# Patient Record
Sex: Male | Born: 1960 | Race: White | Hispanic: No | Smoking: Current every day smoker
Health system: Southern US, Community
[De-identification: ages and names within clinical notes are randomized; demographics above are authoritative.]

## PROBLEM LIST (undated history)

## (undated) DIAGNOSIS — I639 Cerebral infarction, unspecified: Secondary | ICD-10-CM

## (undated) DIAGNOSIS — M545 Low back pain, unspecified: Secondary | ICD-10-CM

---

## 2021-02-01 DIAGNOSIS — U071 COVID-19: Secondary | ICD-10-CM

## 2021-02-01 HISTORY — DX: COVID-19: U07.1

## 2021-05-23 DIAGNOSIS — M25562 Pain in left knee: Secondary | ICD-10-CM | POA: Diagnosis not present

## 2021-05-23 DIAGNOSIS — M545 Low back pain, unspecified: Secondary | ICD-10-CM | POA: Diagnosis not present

## 2021-05-23 DIAGNOSIS — R29898 Other symptoms and signs involving the musculoskeletal system: Secondary | ICD-10-CM | POA: Diagnosis not present

## 2021-05-23 DIAGNOSIS — M2242 Chondromalacia patellae, left knee: Secondary | ICD-10-CM | POA: Diagnosis not present

## 2021-06-01 DIAGNOSIS — M5126 Other intervertebral disc displacement, lumbar region: Secondary | ICD-10-CM | POA: Diagnosis not present

## 2021-06-01 DIAGNOSIS — M48061 Spinal stenosis, lumbar region without neurogenic claudication: Secondary | ICD-10-CM | POA: Diagnosis not present

## 2021-06-01 DIAGNOSIS — M25552 Pain in left hip: Secondary | ICD-10-CM | POA: Diagnosis not present

## 2021-06-01 DIAGNOSIS — M545 Low back pain, unspecified: Secondary | ICD-10-CM | POA: Diagnosis not present

## 2021-07-06 DIAGNOSIS — M545 Low back pain, unspecified: Secondary | ICD-10-CM | POA: Diagnosis not present

## 2021-09-07 ENCOUNTER — Emergency Department (HOSPITAL_COMMUNITY): Payer: Medicare Other

## 2021-09-07 ENCOUNTER — Encounter (HOSPITAL_COMMUNITY): Payer: Self-pay | Admitting: Critical Care Medicine

## 2021-09-07 ENCOUNTER — Inpatient Hospital Stay (HOSPITAL_COMMUNITY)
Admission: EM | Admit: 2021-09-07 | Discharge: 2021-09-16 | DRG: 917 | Disposition: A | Discharge status: Home Health | Payer: Medicare Other | Attending: Internal Medicine | Admitting: Internal Medicine

## 2021-09-07 DIAGNOSIS — E43 Unspecified severe protein-calorie malnutrition: Secondary | ICD-10-CM | POA: Insufficient documentation

## 2021-09-07 DIAGNOSIS — I959 Hypotension, unspecified: Secondary | ICD-10-CM | POA: Diagnosis not present

## 2021-09-07 DIAGNOSIS — Z20822 Contact with and (suspected) exposure to covid-19: Secondary | ICD-10-CM | POA: Diagnosis not present

## 2021-09-07 DIAGNOSIS — Z8616 Personal history of COVID-19: Secondary | ICD-10-CM

## 2021-09-07 DIAGNOSIS — R000 Tachycardia, unspecified: Secondary | ICD-10-CM | POA: Diagnosis not present

## 2021-09-07 DIAGNOSIS — R0689 Other abnormalities of breathing: Secondary | ICD-10-CM | POA: Diagnosis not present

## 2021-09-07 DIAGNOSIS — R778 Other specified abnormalities of plasma proteins: Secondary | ICD-10-CM | POA: Diagnosis not present

## 2021-09-07 DIAGNOSIS — I5021 Acute systolic (congestive) heart failure: Secondary | ICD-10-CM | POA: Diagnosis not present

## 2021-09-07 DIAGNOSIS — F19129 Other psychoactive substance abuse with intoxication, unspecified: Secondary | ICD-10-CM | POA: Diagnosis present

## 2021-09-07 DIAGNOSIS — F17210 Nicotine dependence, cigarettes, uncomplicated: Secondary | ICD-10-CM | POA: Diagnosis not present

## 2021-09-07 DIAGNOSIS — I2489 Other forms of acute ischemic heart disease: Secondary | ICD-10-CM | POA: Diagnosis present

## 2021-09-07 DIAGNOSIS — T405X1A Poisoning by cocaine, accidental (unintentional), initial encounter: Principal | ICD-10-CM | POA: Diagnosis present

## 2021-09-07 DIAGNOSIS — F79 Unspecified intellectual disabilities: Secondary | ICD-10-CM | POA: Diagnosis present

## 2021-09-07 DIAGNOSIS — N179 Acute kidney failure, unspecified: Secondary | ICD-10-CM | POA: Diagnosis not present

## 2021-09-07 DIAGNOSIS — Z6821 Body mass index (BMI) 21.0-21.9, adult: Secondary | ICD-10-CM

## 2021-09-07 DIAGNOSIS — M545 Low back pain, unspecified: Secondary | ICD-10-CM | POA: Diagnosis present

## 2021-09-07 DIAGNOSIS — E8809 Other disorders of plasma-protein metabolism, not elsewhere classified: Secondary | ICD-10-CM | POA: Diagnosis not present

## 2021-09-07 DIAGNOSIS — I248 Other forms of acute ischemic heart disease: Secondary | ICD-10-CM

## 2021-09-07 DIAGNOSIS — F14929 Cocaine use, unspecified with intoxication, unspecified: Principal | ICD-10-CM

## 2021-09-07 DIAGNOSIS — J439 Emphysema, unspecified: Secondary | ICD-10-CM | POA: Diagnosis not present

## 2021-09-07 DIAGNOSIS — J690 Pneumonitis due to inhalation of food and vomit: Secondary | ICD-10-CM | POA: Diagnosis present

## 2021-09-07 DIAGNOSIS — I4901 Ventricular fibrillation: Secondary | ICD-10-CM | POA: Diagnosis present

## 2021-09-07 DIAGNOSIS — I21A1 Myocardial infarction type 2: Secondary | ICD-10-CM | POA: Diagnosis present

## 2021-09-07 DIAGNOSIS — G929 Unspecified toxic encephalopathy: Secondary | ICD-10-CM

## 2021-09-07 DIAGNOSIS — J9601 Acute respiratory failure with hypoxia: Secondary | ICD-10-CM | POA: Diagnosis present

## 2021-09-07 DIAGNOSIS — I462 Cardiac arrest due to underlying cardiac condition: Secondary | ICD-10-CM | POA: Diagnosis present

## 2021-09-07 DIAGNOSIS — Z9911 Dependence on respirator [ventilator] status: Secondary | ICD-10-CM | POA: Diagnosis not present

## 2021-09-07 DIAGNOSIS — I472 Ventricular tachycardia, unspecified: Secondary | ICD-10-CM | POA: Diagnosis present

## 2021-09-07 DIAGNOSIS — I469 Cardiac arrest, cause unspecified: Secondary | ICD-10-CM | POA: Diagnosis not present

## 2021-09-07 DIAGNOSIS — R0902 Hypoxemia: Secondary | ICD-10-CM | POA: Diagnosis not present

## 2021-09-07 DIAGNOSIS — Z8673 Personal history of transient ischemic attack (TIA), and cerebral infarction without residual deficits: Secondary | ICD-10-CM

## 2021-09-07 DIAGNOSIS — I519 Heart disease, unspecified: Secondary | ICD-10-CM | POA: Diagnosis not present

## 2021-09-07 DIAGNOSIS — F1410 Cocaine abuse, uncomplicated: Secondary | ICD-10-CM | POA: Diagnosis present

## 2021-09-07 DIAGNOSIS — R4020 Unspecified coma: Secondary | ICD-10-CM | POA: Diagnosis not present

## 2021-09-07 DIAGNOSIS — G8929 Other chronic pain: Secondary | ICD-10-CM | POA: Diagnosis present

## 2021-09-07 HISTORY — DX: Cerebral infarction, unspecified: I63.9

## 2021-09-07 HISTORY — DX: Low back pain, unspecified: M54.50

## 2021-09-07 LAB — CBC WITH DIFFERENTIAL/PLATELET
Abs Immature Granulocytes: 0.1 10*3/uL — ABNORMAL HIGH (ref 0.00–0.07)
Basophils Absolute: 0.1 10*3/uL (ref 0.0–0.1)
Basophils Relative: 1 %
Eosinophils Absolute: 0.5 10*3/uL (ref 0.0–0.5)
Eosinophils Relative: 5 %
HCT: 44.5 % (ref 39.0–52.0)
Hemoglobin: 15.1 g/dL (ref 13.0–17.0)
Immature Granulocytes: 1 %
Lymphocytes Relative: 44 %
Lymphs Abs: 5 10*3/uL — ABNORMAL HIGH (ref 0.7–4.0)
MCH: 33.4 pg (ref 26.0–34.0)
MCHC: 33.9 g/dL (ref 30.0–36.0)
MCV: 98.5 fL (ref 80.0–100.0)
Monocytes Absolute: 0.9 10*3/uL (ref 0.1–1.0)
Monocytes Relative: 8 %
Neutro Abs: 4.6 10*3/uL (ref 1.7–7.7)
Neutrophils Relative %: 41 %
Platelets: 368 10*3/uL (ref 150–400)
RBC: 4.52 MIL/uL (ref 4.22–5.81)
RDW: 12.6 % (ref 11.5–15.5)
WBC: 11.2 10*3/uL — ABNORMAL HIGH (ref 4.0–10.5)
nRBC: 0 % (ref 0.0–0.2)

## 2021-09-07 LAB — PROTIME-INR
INR: 1 (ref 0.8–1.2)
Prothrombin Time: 12.9 seconds (ref 11.4–15.2)

## 2021-09-07 MED ORDER — FENTANYL CITRATE PF 50 MCG/ML IJ SOSY
50.0000 ug | PREFILLED_SYRINGE | INTRAMUSCULAR | Status: DC | PRN
  Administered 2021-09-08: 50 ug via INTRAVENOUS
  Filled 2021-09-07 (×2): qty 1

## 2021-09-07 MED ORDER — ROCURONIUM BROMIDE 50 MG/5ML IV SOLN
INTRAVENOUS | Status: DC | PRN
  Administered 2021-09-07: 100 mg via INTRAVENOUS

## 2021-09-07 MED ORDER — PROPOFOL 1000 MG/100ML IV EMUL
INTRAVENOUS | Status: DC | PRN
  Administered 2021-09-07: 10 ug via INTRAVENOUS

## 2021-09-07 MED ORDER — IPRATROPIUM-ALBUTEROL 0.5-2.5 (3) MG/3ML IN SOLN
3.0000 mL | Freq: Four times a day (QID) | RESPIRATORY_TRACT | Status: DC
  Administered 2021-09-08 (×4): 3 mL via RESPIRATORY_TRACT
  Filled 2021-09-07 (×4): qty 3

## 2021-09-07 MED ORDER — DOCUSATE SODIUM 50 MG/5ML PO LIQD
100.0000 mg | Freq: Two times a day (BID) | ORAL | Status: DC
  Administered 2021-09-08: 100 mg
  Filled 2021-09-07 (×4): qty 10

## 2021-09-07 MED ORDER — ONDANSETRON HCL 4 MG/2ML IJ SOLN
4.0000 mg | Freq: Four times a day (QID) | INTRAMUSCULAR | Status: DC | PRN
  Administered 2021-09-08: 4 mg via INTRAVENOUS
  Filled 2021-09-07: qty 2

## 2021-09-07 MED ORDER — AMIODARONE HCL IN DEXTROSE 360-4.14 MG/200ML-% IV SOLN
30.0000 mg/h | INTRAVENOUS | Status: DC
  Administered 2021-09-08 – 2021-09-09 (×2): 30 mg/h via INTRAVENOUS
  Filled 2021-09-07 (×2): qty 200

## 2021-09-07 MED ORDER — FENTANYL CITRATE PF 50 MCG/ML IJ SOSY: 50.0000 ug | PREFILLED_SYRINGE | Freq: Once | INTRAMUSCULAR | Status: DC

## 2021-09-07 MED ORDER — POLYETHYLENE GLYCOL 3350 17 G PO PACK
17.0000 g | PACK | Freq: Every day | ORAL | Status: DC
  Administered 2021-09-08 – 2021-09-16 (×8): 17 g
  Filled 2021-09-07 (×7): qty 1

## 2021-09-07 MED ORDER — AMIODARONE HCL IN DEXTROSE 360-4.14 MG/200ML-% IV SOLN
60.0000 mg/h | INTRAVENOUS | Status: DC
  Administered 2021-09-08 (×2): 60 mg/h via INTRAVENOUS
  Filled 2021-09-07: qty 200
  Filled 2021-09-07: qty 400

## 2021-09-07 MED ORDER — ACETAMINOPHEN 160 MG/5ML PO SOLN: 650.0000 mg | ORAL | Status: DC | PRN

## 2021-09-07 MED ORDER — PROPOFOL 1000 MG/100ML IV EMUL
0.0000 ug/kg/min | INTRAVENOUS | Status: DC
  Administered 2021-09-08: 10 ug/kg/min via INTRAVENOUS
  Filled 2021-09-07: qty 100

## 2021-09-07 MED ORDER — LEVETIRACETAM IN NACL 1000 MG/100ML IV SOLN
1000.0000 mg | Freq: Two times a day (BID) | INTRAVENOUS | Status: DC
  Administered 2021-09-08 – 2021-09-09 (×3): 1000 mg via INTRAVENOUS
  Filled 2021-09-07 (×3): qty 100

## 2021-09-07 MED ORDER — AMIODARONE LOAD VIA INFUSION
150.0000 mg | Freq: Once | INTRAVENOUS | Status: AC
  Administered 2021-09-08: 150 mg via INTRAVENOUS
  Filled 2021-09-07: qty 83.34

## 2021-09-07 MED ORDER — ACETAMINOPHEN 650 MG RE SUPP: 650.0000 mg | RECTAL | Status: DC | PRN

## 2021-09-07 MED ORDER — ETOMIDATE 2 MG/ML IV SOLN
INTRAVENOUS | Status: DC | PRN
  Administered 2021-09-07: 20 mg via INTRAVENOUS

## 2021-09-07 MED ORDER — SODIUM CHLORIDE 0.9 % IV SOLN
INTRAVENOUS | Status: DC | PRN
  Administered 2021-09-07: 1000 mL via INTRAVENOUS

## 2021-09-07 MED ORDER — FENTANYL 2500MCG IN NS 250ML (10MCG/ML) PREMIX INFUSION: 50.0000 ug/h | INTRAVENOUS | Status: DC

## 2021-09-07 MED ORDER — FENTANYL BOLUS VIA INFUSION: 50.0000 ug | INTRAVENOUS | Status: DC | PRN

## 2021-09-07 MED ORDER — DOCUSATE SODIUM 50 MG/5ML PO LIQD: 100.0000 mg | Freq: Two times a day (BID) | ORAL | Status: DC

## 2021-09-07 MED ORDER — HEPARIN SODIUM (PORCINE) 5000 UNIT/ML IJ SOLN: 5000.0000 [IU] | Freq: Three times a day (TID) | INTRAMUSCULAR | Status: DC

## 2021-09-07 MED ORDER — MAGNESIUM SULFATE 50 % IJ SOLN
INTRAMUSCULAR | Status: DC | PRN
  Administered 2021-09-07: 2 g via INTRAVENOUS

## 2021-09-07 MED ORDER — PANTOPRAZOLE 2 MG/ML SUSPENSION
40.0000 mg | Freq: Every day | ORAL | Status: DC
  Administered 2021-09-08: 40 mg
  Filled 2021-09-07: qty 20

## 2021-09-07 MED ORDER — ACETAMINOPHEN 325 MG PO TABS: 650.0000 mg | ORAL_TABLET | ORAL | Status: DC | PRN

## 2021-09-07 MED ORDER — FENTANYL CITRATE PF 50 MCG/ML IJ SOSY
50.0000 ug | PREFILLED_SYRINGE | INTRAMUSCULAR | Status: AC | PRN
  Administered 2021-09-08 (×3): 50 ug via INTRAVENOUS
  Filled 2021-09-07 (×3): qty 1

## 2021-09-07 NOTE — ED Triage Notes (Addendum)
Pt bib ems as post arrest. Pt had witnessed seizure by family and went into cardiac arrest. Per ems, pt was in vtach/vfib. CPR initiated on ems arrival - 6 minutes of cpr, 1 epi and 1 defib with pulses gained. Pt went into vfib en route - 1 defib given. + pulses on arrival. Respirations assisted w/ bvm on arrival. 100mg  lidocaine, 1 epi, and 4 narcan given PTA.

## 2021-09-07 NOTE — ED Notes (Signed)
Patient transported to CT with RN 

## 2021-09-07 NOTE — Progress Notes (Signed)
Pt transported to CT and back to [facility] w/o complications. RT will cont to monitor.

## 2021-09-07 NOTE — ED Provider Notes (Signed)
[facility] EMERGENCY DEPARTMENT Provider Note   CSN: [id number] Arrival date & time: 09/07/21  2244     History  Chief Complaint  Patient presents with   post cpr    Scott Grimes is a 61 y.o. male.  HPI Patient does not have significant known past medical history.  EMS was called to the patient's home for seizure-like activity and unresponsive.  On EMS arrival they report patient had arm flexion but not active seizure.  He was unresponsive.  First rhythm V. tach V-fib patient was defibrillated.  Patient at 6 minutes of CPR.  They had return of circulation.  On route patient had 1 more episode of V-fib with a defibrillation just prior to arrival and milligrams of lidocaine administered and 4 of Narcan given prior to arrival.  Patient was not intubated on arrival and had regular labored respiration.  EMS reports the patient did not follow commands at any point time.  He did not make any vocalizations.  They do indicate he would track with his eyes.    Home Medications Prior to Admission medications   Not on File      Allergies    Patient has no allergy information on record.    Review of Systems   Review of Systems Level 5 caveat cannot obtain review of systems due to condition Physical Exam Updated Vital Signs BP 113/81   Pulse 80   Temp (!) 96.5 F (35.8 C) (Temporal)   Resp 20   Ht 5\' 11"  (1.803 m)   SpO2 100%  Physical Exam Constitutional:      Comments: Patient is getting bag-valve-mask respiratory assist on arrival.  Respirations are labored.  General physical condition appears good.  HENT:     Head: Normocephalic and atraumatic.     Mouth/Throat:     Comments: Edentulous Eyes:     Comments: Pupils are 3 mm and responsive  Neck:     Comments: Jugular veins very distended Cardiovascular:     Comments: Heart is regular.  Distant heart sounds. Pulmonary:     Comments: Coarse breath sounds with bagging. Abdominal:     Comments: Abdomen  soft and nondistended  Musculoskeletal:     Comments: No evident extremity deformities.  Radial pulses are 1+ symmetric.  Lower extremities are good condition without peripheral edema.  Skin:    Comments: Skin is pale.  Cyanotic around the neck.  Neurological:     Comments: Patient is making spontaneous respirations.  He is not making spontaneous movements.  No withdrawal from pain    ED Results / Procedures / Treatments   Labs (all labs ordered are listed, but only abnormal results are displayed) Labs Reviewed  RESP PANEL BY RT-PCR (FLU A&B, COVID) [id number]  CULTURE, BLOOD (ROUTINE X 2)  CULTURE, BLOOD (ROUTINE X 2)  COMPREHENSIVE METABOLIC PANEL  ETHANOL  ACETAMINOPHEN LEVEL  LACTIC ACID, PLASMA  LACTIC ACID, PLASMA  LIPASE, BLOOD  SALICYLATE LEVEL  CBC WITH DIFFERENTIAL/PLATELET  PROTIME-INR  URINALYSIS, ROUTINE W REFLEX MICROSCOPIC  RAPID URINE DRUG SCREEN, HOSP PERFORMED  MAGNESIUM  PHOSPHORUS  TSH  AMMONIA  TROPONIN I (HIGH SENSITIVITY)    EKG EKG Interpretation  Date/Time:  Wednesday September 07 2021 22:48:01 EDT Ventricular Rate:  99 PR Interval:  112 QRS Duration: 118 QT Interval:  345 QTC Calculation: 443 R Axis:   46 Text Interpretation: Sinus rhythm Nonspecific intraventricular conduction delay Low voltage, precordial leads Repol abnrm suggests ischemia, diffuse leads Minimal ST elevation,  inferior leads Baseline wander in lead(s) I V5 V6 agree. no old comparison Confirmed by Charlesetta Shanks [phone]) on 09/07/2021 11:14:14 PM  Radiology No results found.  Procedures Procedure Name: Intubation Date/Time: 09/07/2021 11:14 PM Performed by: Charlesetta Shanks, MD Pre-anesthesia Checklist: Emergency Drugs available, Suction available and Patient being monitored Oxygen Delivery Method: Ambu bag Preoxygenation: Pre-oxygenation with 100% oxygen Induction Type: Rapid sequence Ventilation: Mask ventilation without difficulty Laryngoscope Size: Glidescope and 3 Grade  View: Grade I Tube size: 7.5 mm Number of attempts: 1 Placement Confirmation: ETT inserted through vocal cords under direct vision, Positive ETCO2, CO2 detector and Breath sounds checked- equal and bilateral Secured at: 23 cm Tube secured with: ETT holder Dental Injury: Teeth and Oropharynx as per pre-operative assessment  Difficulty Due To: Difficulty was unanticipated    CRITICAL CARE Performed by: Charlesetta Shanks   Total critical care time: 60 minutes  Critical care time was exclusive of separately billable procedures and treating other patients.  Critical care was necessary to treat or prevent imminent or life-threatening deterioration.  Critical care was time spent personally by me on the following activities: development of treatment plan with patient and/or surrogate as well as nursing, discussions with consultants, evaluation of patient's response to treatment, examination of patient, obtaining history from patient or surrogate, ordering and performing treatments and interventions, ordering and review of laboratory studies, ordering and review of radiographic studies, pulse oximetry and re-evaluation of patient's condition.      Medications Ordered in ED Medications  magnesium sulfate (IV Push/IM) injection (2 g Intravenous Given 09/07/21 2246)  0.9 %  sodium chloride infusion (1,000 mLs Intravenous New Bag/Given 09/07/21 2245)  etomidate (AMIDATE) injection (20 mg Intravenous Given 09/07/21 2252)  rocuronium (ZEMURON) injection (100 mg Intravenous Given 09/07/21 2252)  propofol (DIPRIVAN) 1000 MG/100ML infusion (10 mcg Intravenous New Bag/Given 09/07/21 2300)    ED Course/ Medical Decision Making/ A&P                           Medical Decision Making Amount and/or Complexity of Data Reviewed Labs: ordered. Radiology: ordered.  Risk Prescription drug management. Decision regarding hospitalization.   Patient had arrest at home.  He was found to be in V-fib V. tach.  EMS  got return pulses.  He had 1 additional episode of V-fib prior to arrival.  Patient required 2 episodes of shock and epinephrine.  Patient assessed on arrival and breathing spontaneously with bag valve support.  Patient is obtunded and unresponsive patient is required intubation for airway protection and support.  EMS reported episode of torsade just prior to arrival.  2 g of magnesium ordered by myself to be given IV push.  monitor has narrow complex regular rhythm.  EKG obtained has ischemic ST depression but not STEMI appearance.  Blood pressure has remained stable.  Patient had gotten Narcan prior to arrival with no change.  CBG was in the 200s.  After completing stabilization patient is taken directly to CT scan for concern of potential head injury\intracerebral hemorrhage.  DDx includes ACS\PE\aneurysm or dissection/overdose/\seizure with anoxic brain injury.  Portable chest x-ray reviewed by myself at bedside.  No pneumothorax, mediastinum no significant widening, lungs grossly clear.  Postintubation, patient's vital signs are stable.  He has not required pressors.  Consult: Reviewed with critical care for admission.  Patient has been evaluated in ED and critical care will assume ongoing management and treatment        Final Clinical Impression(s) /  ED Diagnoses Final diagnoses:  Cardiac arrest [facility])    Rx / DC Orders ED Discharge Orders     None         Charlesetta Shanks, MD 09/10/21 [phone]

## 2021-09-07 NOTE — H&P (Signed)
NAME:  Scott Grimes, MRN:  [id number], DOB:  07/05/1960, LOS: 0 ADMISSION DATE:  09/07/2021, CONSULTATION DATE:  09/07/21 REFERRING MD:  Donato Schultz, CHIEF COMPLAINT:  VT arrest   History of Present Illness:  Scott Grimes is a 61 y/o gentleman with a history of LBP who had a witnessed seizure and remained unresponsive. When EMS arrived he was found to have VT then VF. CPR was initiated by EMS when they arrived and he underwent cardioversion with ROSC. He had another episode of VF in transport with cardioversion and ROSC. Lidocaine was administered by EMS as well as narcan. He was intubated in the ED; he was gagging too much to be intubated in the field. Per EMS he was never able to follow commands but was tracking with his eyes.   Per his caregiver, he has been smoking 3-4 ppd recently. In the past he has done drugs, including meth when he had a roommate who was also using meth. Tonight a friend of his who has a history of drugs came over. Later he had a bag of white powder with a straw under the ashtray where he was smoking. Shortly after while he was sitting in his chair, he reported to his caretaker "that didn't taste good; I'm not going to do that again", said he was not feeling well, then fell on the ground not gasping for air. She started CPR and called 911.  Pertinent  Medical History  Chronic back pain Tobacco abuse Drug abuse stroke On disability for intellectual disability  Significant Hospital Events: Including procedures, antibiotic start and stop dates in addition to other pertinent events   6/7 admitted  Interim History / Subjective:    Objective   Blood pressure 130/80, pulse 84, temperature (!) 96.5 F (35.8 C), temperature source Temporal, resp. rate 20, height 5\' 11"  (1.803 m), weight 68 kg, SpO2 100 %.    Vent Mode: PRVC FiO2 (%):  [40 %-100 %] 40 % Set Rate:  [20 bmp] 20 bmp Vt Set:  [600 mL] 600 mL PEEP:  [5 cmH20] 5 cmH20 Plateau Pressure:  [13 cmH20] 13 cmH20    Intake/Output Summary (Last 24 hours) at 09/08/2021 0021 Last data filed at 09/07/2021 2337 Gross per 24 hour  Intake 1000 ml  Output --  Net 1000 ml   Filed Weights   09/07/21 2348  Weight: 68 kg    Examination: General: critically ill appearing man lying in bed in NAD, examined <1 h after rocuronium HENT: [place]/AT, eyes anicteric Lungs: no wheezing, faint rhonchi, obstructed expiratory loops on vent. Not breathing over set rate Cardiovascular: S1S2, RRR Abdomen: soft, NT Extremities: cool feet, no peripheral edema or cyanosis Neuro: under NMB, pupils briskly reactive Derm: warm, dry, no diffuse rashes  EKG: NSR, normal axis, normal intervals. ST depressions laterally.  CXR personally reviewed> ETT ~7cm above carina, no opacities, normal cardiac silhouette  CT head: no acute bleeding, no obvious masses  WBC 11.2 CMP pending Ammonia 24  Resolved Hospital Problem list     Assessment & Plan:  Cardiac arrest- VT, VF- potentially due to acute drug intoxication -amiodarone infusion -post-arrest supportive care-- TTM normothermia, prevent fevers -echo -repeat EKG; initial EKG with ST depressions -monitor electrolytes, replete as needed  Acute respiratory failure with hypoxia, suspect obstructive lung disease  -LTVV -VAP prevention protocol -PAD protocol for sedation -daily SAT & SBT as appropriate -bronchodilators -empiric aspiration pneumonia coverage -covid test pending  Acute encephalopathy, concern for anoxic vs toxic encephalopathy -propofol -supportive  care -UDS pending  Concern for seizure -ok to con't empiric keppra -EEG  Tobacco abuse -will recommend cessation when appropriate  At risk for malnutrition -TF  Caregiver Melissa updated via phone. She provided phone number for his sister, but she did not answer the phone and we were unable to leave a VM.  Best Practice (right click and "Reselect all SmartList Selections" daily)   Diet/type:  tubefeeds DVT prophylaxis: prophylactic heparin  GI prophylaxis: PPI Lines: N/A Foley:  Yes, and it is still needed Code Status:  full code Last date of multidisciplinary goals of care discussion [6/7- with caregiver ]  Labs   CBC: Recent Labs  Lab 09/07/21 2325  WBC 11.2*  NEUTROABS 4.6  HGB 15.1  HCT 44.5  MCV 98.5  PLT 123XX123    Basic Metabolic Panel: No results for input(s): NA, K, CL, CO2, GLUCOSE, BUN, CREATININE, CALCIUM, MG, PHOS in the last 168 hours. GFR: CrCl cannot be calculated (No successful lab value found.). Recent Labs  Lab 09/07/21 2325  WBC 11.2*    Liver Function Tests: No results for input(s): AST, ALT, ALKPHOS, BILITOT, PROT, ALBUMIN in the last 168 hours. No results for input(s): LIPASE, AMYLASE in the last 168 hours. No results for input(s): AMMONIA in the last 168 hours.  ABG No results found for: PHART, PCO2ART, PO2ART, HCO3, TCO2, ACIDBASEDEF, O2SAT   Coagulation Profile: Recent Labs  Lab 09/07/21 2325  INR 1.0    Cardiac Enzymes: No results for input(s): CKTOTAL, CKMB, CKMBINDEX, TROPONINI in the last 168 hours.  HbA1C: No results found for: HGBA1C  CBG: No results for input(s): GLUCAP in the last 168 hours.  Review of Systems:   Unable to be obtained due to intubated/ sedated.  Past Medical History:  He,  has a past medical history of COVID-19 (02/2021), Low back pain, and Stroke ([place]).   Surgical History:  History reviewed. No pertinent surgical history.   Social History:   reports that he has been smoking cigarettes. He does not have any smokeless tobacco history on file.   Family History:  His family history is not on file.   Allergies Not on File   Home Medications  Prior to Admission medications   Not on File     Critical care time: 55 min.     Julian Hy, DO 09/08/21 12:21 AM [facility]

## 2021-09-08 ENCOUNTER — Emergency Department (HOSPITAL_COMMUNITY): Payer: Medicare Other

## 2021-09-08 ENCOUNTER — Encounter (HOSPITAL_COMMUNITY): Payer: Self-pay | Admitting: Critical Care Medicine

## 2021-09-08 ENCOUNTER — Inpatient Hospital Stay (HOSPITAL_COMMUNITY): Payer: Medicare Other

## 2021-09-08 DIAGNOSIS — J439 Emphysema, unspecified: Secondary | ICD-10-CM | POA: Diagnosis present

## 2021-09-08 DIAGNOSIS — G8929 Other chronic pain: Secondary | ICD-10-CM | POA: Diagnosis present

## 2021-09-08 DIAGNOSIS — J9601 Acute respiratory failure with hypoxia: Secondary | ICD-10-CM | POA: Diagnosis present

## 2021-09-08 DIAGNOSIS — I472 Ventricular tachycardia, unspecified: Secondary | ICD-10-CM | POA: Diagnosis present

## 2021-09-08 DIAGNOSIS — I469 Cardiac arrest, cause unspecified: Secondary | ICD-10-CM | POA: Diagnosis present

## 2021-09-08 DIAGNOSIS — I21A1 Myocardial infarction type 2: Secondary | ICD-10-CM | POA: Diagnosis present

## 2021-09-08 DIAGNOSIS — I462 Cardiac arrest due to underlying cardiac condition: Secondary | ICD-10-CM | POA: Diagnosis present

## 2021-09-08 DIAGNOSIS — R778 Other specified abnormalities of plasma proteins: Secondary | ICD-10-CM | POA: Diagnosis not present

## 2021-09-08 DIAGNOSIS — G929 Unspecified toxic encephalopathy: Secondary | ICD-10-CM | POA: Diagnosis present

## 2021-09-08 DIAGNOSIS — F14929 Cocaine use, unspecified with intoxication, unspecified: Secondary | ICD-10-CM | POA: Diagnosis not present

## 2021-09-08 DIAGNOSIS — F19129 Other psychoactive substance abuse with intoxication, unspecified: Secondary | ICD-10-CM | POA: Diagnosis present

## 2021-09-08 DIAGNOSIS — I5021 Acute systolic (congestive) heart failure: Secondary | ICD-10-CM | POA: Diagnosis not present

## 2021-09-08 DIAGNOSIS — N179 Acute kidney failure, unspecified: Secondary | ICD-10-CM | POA: Diagnosis not present

## 2021-09-08 DIAGNOSIS — F1410 Cocaine abuse, uncomplicated: Secondary | ICD-10-CM | POA: Diagnosis present

## 2021-09-08 DIAGNOSIS — I4901 Ventricular fibrillation: Secondary | ICD-10-CM | POA: Diagnosis present

## 2021-09-08 DIAGNOSIS — M545 Low back pain, unspecified: Secondary | ICD-10-CM | POA: Diagnosis present

## 2021-09-08 DIAGNOSIS — Z6821 Body mass index (BMI) 21.0-21.9, adult: Secondary | ICD-10-CM | POA: Diagnosis not present

## 2021-09-08 DIAGNOSIS — Z20822 Contact with and (suspected) exposure to covid-19: Secondary | ICD-10-CM | POA: Diagnosis present

## 2021-09-08 DIAGNOSIS — T405X1A Poisoning by cocaine, accidental (unintentional), initial encounter: Secondary | ICD-10-CM | POA: Diagnosis present

## 2021-09-08 DIAGNOSIS — I519 Heart disease, unspecified: Secondary | ICD-10-CM | POA: Diagnosis not present

## 2021-09-08 DIAGNOSIS — F79 Unspecified intellectual disabilities: Secondary | ICD-10-CM | POA: Diagnosis present

## 2021-09-08 DIAGNOSIS — F17210 Nicotine dependence, cigarettes, uncomplicated: Secondary | ICD-10-CM | POA: Diagnosis present

## 2021-09-08 DIAGNOSIS — J690 Pneumonitis due to inhalation of food and vomit: Secondary | ICD-10-CM | POA: Diagnosis present

## 2021-09-08 DIAGNOSIS — Z8616 Personal history of COVID-19: Secondary | ICD-10-CM | POA: Diagnosis not present

## 2021-09-08 DIAGNOSIS — E8809 Other disorders of plasma-protein metabolism, not elsewhere classified: Secondary | ICD-10-CM | POA: Diagnosis not present

## 2021-09-08 DIAGNOSIS — I959 Hypotension, unspecified: Secondary | ICD-10-CM | POA: Diagnosis not present

## 2021-09-08 DIAGNOSIS — I248 Other forms of acute ischemic heart disease: Secondary | ICD-10-CM | POA: Diagnosis not present

## 2021-09-08 DIAGNOSIS — E43 Unspecified severe protein-calorie malnutrition: Secondary | ICD-10-CM | POA: Diagnosis present

## 2021-09-08 LAB — URINALYSIS, ROUTINE W REFLEX MICROSCOPIC
Bilirubin Urine: NEGATIVE
Glucose, UA: 50 mg/dL — AB
Ketones, ur: NEGATIVE mg/dL
Leukocytes,Ua: NEGATIVE
Nitrite: NEGATIVE
Protein, ur: 100 mg/dL — AB
Specific Gravity, Urine: 1.01 (ref 1.005–1.030)
pH: 5 (ref 5.0–8.0)

## 2021-09-08 LAB — I-STAT ARTERIAL BLOOD GAS, ED
Acid-base deficit: 5 mmol/L — ABNORMAL HIGH (ref 0.0–2.0)
Bicarbonate: 18.1 mmol/L — ABNORMAL LOW (ref 20.0–28.0)
Calcium, Ion: 1.11 mmol/L — ABNORMAL LOW (ref 1.15–1.40)
HCT: 36 % — ABNORMAL LOW (ref 39.0–52.0)
Hemoglobin: 12.2 g/dL — ABNORMAL LOW (ref 13.0–17.0)
O2 Saturation: 100 %
Potassium: 3 mmol/L — ABNORMAL LOW (ref 3.5–5.1)
Sodium: 136 mmol/L (ref 135–145)
TCO2: 19 mmol/L — ABNORMAL LOW (ref 22–32)
pCO2 arterial: 26.1 mmHg — ABNORMAL LOW (ref 32–48)
pH, Arterial: 7.448 (ref 7.35–7.45)
pO2, Arterial: 214 mmHg — ABNORMAL HIGH (ref 83–108)

## 2021-09-08 LAB — GLUCOSE, CAPILLARY
Glucose-Capillary: 97 mg/dL (ref 70–99)
Glucose-Capillary: 84 mg/dL (ref 70–99)
Glucose-Capillary: 106 mg/dL — ABNORMAL HIGH (ref 70–99)
Glucose-Capillary: 94 mg/dL (ref 70–99)
Glucose-Capillary: 106 mg/dL — ABNORMAL HIGH (ref 70–99)
Glucose-Capillary: 94 mg/dL (ref 70–99)

## 2021-09-08 LAB — BASIC METABOLIC PANEL
Anion gap: 11 (ref 5–15)
BUN: 11 mg/dL (ref 6–20)
CO2: 22 mmol/L (ref 22–32)
Calcium: 8.3 mg/dL — ABNORMAL LOW (ref 8.9–10.3)
Chloride: 106 mmol/L (ref 98–111)
Creatinine, Ser: 1.11 mg/dL (ref 0.61–1.24)
GFR, Estimated: >60 mL/min (ref >60)
Glucose, Bld: 108 mg/dL — ABNORMAL HIGH (ref 70–99)
Potassium: 3.8 mmol/L (ref 3.5–5.1)
Sodium: 139 mmol/L (ref 135–145)

## 2021-09-08 LAB — TROPONIN I (HIGH SENSITIVITY)
Troponin I (High Sensitivity): 3375 ng/L (ref <18)
Troponin I (High Sensitivity): 2994 ng/L (ref <18)

## 2021-09-08 LAB — CBC
HCT: 40.8 % (ref 39.0–52.0)
Hemoglobin: 14.2 g/dL (ref 13.0–17.0)
MCH: 32.5 pg (ref 26.0–34.0)
MCHC: 34.8 g/dL (ref 30.0–36.0)
MCV: 93.4 fL (ref 80.0–100.0)
Platelets: 297 10*3/uL (ref 150–400)
RBC: 4.37 MIL/uL (ref 4.22–5.81)
RDW: 12.4 % (ref 11.5–15.5)
WBC: 10.2 10*3/uL (ref 4.0–10.5)
nRBC: 0 % (ref 0.0–0.2)

## 2021-09-08 LAB — ECHOCARDIOGRAM COMPLETE
AR max vel: 3.86 cm2
AV Area VTI: 4.07 cm2
AV Area mean vel: 3.68 cm2
AV Mean grad: 3 mmHg
AV Peak grad: 6.7 mmHg
Ao pk vel: 1.29 m/s
Height: 71 in
S' Lateral: 3.5 cm
Single Plane A4C EF: 31.2 %
Weight: 2592.61 oz

## 2021-09-08 LAB — RAPID URINE DRUG SCREEN, HOSP PERFORMED
Amphetamines: NOT DETECTED
Barbiturates: NOT DETECTED
Benzodiazepines: NOT DETECTED
Cocaine: POSITIVE — AB
Opiates: NOT DETECTED
Tetrahydrocannabinol: NOT DETECTED

## 2021-09-08 LAB — RESP PANEL BY RT-PCR (FLU A&B, COVID) ARPGX2
Influenza A by PCR: NEGATIVE
Influenza B by PCR: NEGATIVE
SARS Coronavirus 2 by RT PCR: NEGATIVE

## 2021-09-08 LAB — COMPREHENSIVE METABOLIC PANEL
ALT: 58 U/L — ABNORMAL HIGH (ref 0–44)
AST: 88 U/L — ABNORMAL HIGH (ref 15–41)
Albumin: 3.2 g/dL — ABNORMAL LOW (ref 3.5–5.0)
Alkaline Phosphatase: 137 U/L — ABNORMAL HIGH (ref 38–126)
Anion gap: 18 — ABNORMAL HIGH (ref 5–15)
BUN: 13 mg/dL (ref 6–20)
CO2: 17 mmol/L — ABNORMAL LOW (ref 22–32)
Calcium: 8.7 mg/dL — ABNORMAL LOW (ref 8.9–10.3)
Chloride: 102 mmol/L (ref 98–111)
Creatinine, Ser: 1.5 mg/dL — ABNORMAL HIGH (ref 0.61–1.24)
GFR, Estimated: 54 mL/min — ABNORMAL LOW (ref >60)
Glucose, Bld: 222 mg/dL — ABNORMAL HIGH (ref 70–99)
Potassium: 3.7 mmol/L (ref 3.5–5.1)
Sodium: 137 mmol/L (ref 135–145)
Total Bilirubin: 0.6 mg/dL (ref 0.3–1.2)
Total Protein: 5.9 g/dL — ABNORMAL LOW (ref 6.5–8.1)

## 2021-09-08 LAB — HEMOGLOBIN A1C
Hgb A1c MFr Bld: 5.3 % (ref 4.8–5.6)
Mean Plasma Glucose: 105.41 mg/dL

## 2021-09-08 LAB — MAGNESIUM
Magnesium: 2.3 mg/dL (ref 1.7–2.4)
Magnesium: 2.6 mg/dL — ABNORMAL HIGH (ref 1.7–2.4)

## 2021-09-08 LAB — CBG MONITORING, ED
Glucose-Capillary: 181 mg/dL — ABNORMAL HIGH (ref 70–99)
Glucose-Capillary: 188 mg/dL — ABNORMAL HIGH (ref 70–99)

## 2021-09-08 LAB — HEPATIC FUNCTION PANEL
ALT: 54 U/L — ABNORMAL HIGH (ref 0–44)
AST: 71 U/L — ABNORMAL HIGH (ref 15–41)
Albumin: 3.2 g/dL — ABNORMAL LOW (ref 3.5–5.0)
Alkaline Phosphatase: 125 U/L (ref 38–126)
Bilirubin, Direct: <0.1 mg/dL (ref 0.0–0.2)
Total Bilirubin: 0.5 mg/dL (ref 0.3–1.2)
Total Protein: 6 g/dL — ABNORMAL LOW (ref 6.5–8.1)

## 2021-09-08 LAB — AMMONIA: Ammonia: 24 umol/L (ref 9–35)

## 2021-09-08 LAB — LACTIC ACID, PLASMA
Lactic Acid, Venous: 3.5 mmol/L (ref 0.5–1.9)
Lactic Acid, Venous: 6 mmol/L (ref 0.5–1.9)

## 2021-09-08 LAB — HEPARIN LEVEL (UNFRACTIONATED)
Heparin Unfractionated: 0.17 IU/mL — ABNORMAL LOW (ref 0.30–0.70)
Heparin Unfractionated: 0.2 IU/mL — ABNORMAL LOW (ref 0.30–0.70)

## 2021-09-08 LAB — ACETAMINOPHEN LEVEL: Acetaminophen (Tylenol), Serum: <10 ug/mL — ABNORMAL LOW (ref 10–30)

## 2021-09-08 LAB — SALICYLATE LEVEL: Salicylate Lvl: <7 mg/dL — ABNORMAL LOW (ref 7.0–30.0)

## 2021-09-08 LAB — TRIGLYCERIDES: Triglycerides: 89 mg/dL (ref <150)

## 2021-09-08 LAB — MRSA NEXT GEN BY PCR, NASAL: MRSA by PCR Next Gen: NOT DETECTED

## 2021-09-08 LAB — PHOSPHORUS
Phosphorus: 4 mg/dL (ref 2.5–4.6)
Phosphorus: 2.6 mg/dL (ref 2.5–4.6)

## 2021-09-08 LAB — LIPASE, BLOOD: Lipase: 35 U/L (ref 11–51)

## 2021-09-08 LAB — ETHANOL: Alcohol, Ethyl (B): <10 mg/dL (ref <10)

## 2021-09-08 LAB — HIV ANTIBODY (ROUTINE TESTING W REFLEX): HIV Screen 4th Generation wRfx: NONREACTIVE

## 2021-09-08 LAB — TSH: TSH: 3.788 u[IU]/mL (ref 0.350–4.500)

## 2021-09-08 MED ORDER — PROSOURCE TF PO LIQD
45.0000 mL | Freq: Two times a day (BID) | ORAL | Status: DC
  Administered 2021-09-08 – 2021-09-10 (×2): 45 mL
  Filled 2021-09-08 (×3): qty 45

## 2021-09-08 MED ORDER — ADULT MULTIVITAMIN W/MINERALS CH
1.0000 | ORAL_TABLET | Freq: Every day | ORAL | Status: DC
  Administered 2021-09-10 – 2021-09-16 (×7): 1 via ORAL
  Filled 2021-09-08 (×7): qty 1

## 2021-09-08 MED ORDER — ASPIRIN 325 MG PO TABS
325.0000 mg | ORAL_TABLET | Freq: Once | ORAL | Status: AC
Start: 2021-09-08 — End: 2021-09-08
  Administered 2021-09-08: 325 mg
  Filled 2021-09-08: qty 1

## 2021-09-08 MED ORDER — ORAL CARE MOUTH RINSE
15.0000 mL | OROMUCOSAL | Status: DC
  Administered 2021-09-08 – 2021-09-09 (×10): 15 mL via OROMUCOSAL

## 2021-09-08 MED ORDER — SODIUM CHLORIDE 0.9 % IV SOLN
250.0000 mL | INTRAVENOUS | Status: DC
  Administered 2021-09-08: 250 mL via INTRAVENOUS

## 2021-09-08 MED ORDER — LORAZEPAM 1 MG PO TABS: 1.0000 mg | ORAL_TABLET | ORAL | Status: AC | PRN

## 2021-09-08 MED ORDER — IOHEXOL 350 MG/ML SOLN
100.0000 mL | Freq: Once | INTRAVENOUS | Status: AC | PRN
Start: 2021-09-08 — End: 2021-09-08
  Administered 2021-09-08: 100 mL via INTRAVENOUS

## 2021-09-08 MED ORDER — LACTATED RINGERS IV BOLUS
2000.0000 mL | Freq: Once | INTRAVENOUS | Status: AC
  Administered 2021-09-08: 2000 mL via INTRAVENOUS

## 2021-09-08 MED ORDER — CHLORHEXIDINE GLUCONATE CLOTH 2 % EX PADS
6.0000 | MEDICATED_PAD | Freq: Every day | CUTANEOUS | Status: DC
  Administered 2021-09-08 – 2021-09-09 (×3): 6 via TOPICAL

## 2021-09-08 MED ORDER — FOLIC ACID 1 MG PO TABS: 1.0000 mg | ORAL_TABLET | Freq: Every day | ORAL | Status: DC

## 2021-09-08 MED ORDER — POTASSIUM CHLORIDE 20 MEQ PO PACK
40.0000 meq | PACK | Freq: Once | ORAL | Status: AC
Start: 2021-09-08 — End: 2021-09-08
  Administered 2021-09-08: 40 meq
  Filled 2021-09-08: qty 2

## 2021-09-08 MED ORDER — AMPICILLIN-SULBACTAM SODIUM 3 (2-1) G IJ SOLR
3.0000 g | Freq: Once | INTRAMUSCULAR | Status: AC
  Administered 2021-09-08: 3 g via INTRAVENOUS
  Filled 2021-09-08 (×2): qty 8

## 2021-09-08 MED ORDER — ASPIRIN 81 MG PO CHEW
81.0000 mg | CHEWABLE_TABLET | Freq: Every day | ORAL | Status: DC
Start: 2021-09-09 — End: 2021-09-09

## 2021-09-08 MED ORDER — NOREPINEPHRINE 4 MG/250ML-% IV SOLN: 0.0000 ug/min | INTRAVENOUS | Status: DC

## 2021-09-08 MED ORDER — LORAZEPAM 2 MG/ML IJ SOLN
1.0000 mg | INTRAMUSCULAR | Status: AC | PRN
  Administered 2021-09-08: 2 mg via INTRAVENOUS
  Filled 2021-09-08: qty 2

## 2021-09-08 MED ORDER — HEPARIN BOLUS VIA INFUSION
2000.0000 [IU] | Freq: Once | INTRAVENOUS | Status: AC
Start: 2021-09-08 — End: 2021-09-08
  Administered 2021-09-08: 2000 [IU] via INTRAVENOUS
  Filled 2021-09-08: qty 2000

## 2021-09-08 MED ORDER — FENTANYL BOLUS VIA INFUSION: 100.0000 ug | INTRAVENOUS | Status: DC | PRN

## 2021-09-08 MED ORDER — ACETAMINOPHEN 650 MG RE SUPP: 650.0000 mg | RECTAL | Status: DC | PRN

## 2021-09-08 MED ORDER — THIAMINE HCL 100 MG PO TABS: 100.0000 mg | ORAL_TABLET | Freq: Every day | ORAL | Status: DC

## 2021-09-08 MED ORDER — ACETAMINOPHEN 325 MG PO TABS
650.0000 mg | ORAL_TABLET | ORAL | Status: DC | PRN
  Administered 2021-09-09 – 2021-09-13 (×3): 650 mg via ORAL
  Filled 2021-09-08 (×4): qty 2

## 2021-09-08 MED ORDER — ACETAMINOPHEN 160 MG/5ML PO SOLN
650.0000 mg | ORAL | Status: DC | PRN
  Administered 2021-09-08: 650 mg
  Filled 2021-09-08: qty 20.3

## 2021-09-08 MED ORDER — VITAL HIGH PROTEIN PO LIQD
1000.0000 mL | ORAL | Status: DC
  Filled 2021-09-08: qty 1000

## 2021-09-08 MED ORDER — LACTATED RINGERS IV BOLUS
1000.0000 mL | Freq: Once | INTRAVENOUS | Status: AC
  Administered 2021-09-08: 1000 mL via INTRAVENOUS

## 2021-09-08 MED ORDER — MAGNESIUM SULFATE 2 GM/50ML IV SOLN
2.0000 g | Freq: Once | INTRAVENOUS | Status: AC
  Administered 2021-09-08: 2 g via INTRAVENOUS
  Filled 2021-09-08: qty 50

## 2021-09-08 MED ORDER — LACTATED RINGERS IV BOLUS: 1000.0000 mL | Freq: Once | INTRAVENOUS | Status: DC

## 2021-09-08 MED ORDER — FENTANYL 2500MCG IN NS 250ML (10MCG/ML) PREMIX INFUSION
0.0000 ug/h | INTRAVENOUS | Status: DC
  Administered 2021-09-08: 50 ug/h via INTRAVENOUS
  Filled 2021-09-08: qty 250

## 2021-09-08 MED ORDER — POTASSIUM CHLORIDE 20 MEQ PO PACK
40.0000 meq | PACK | Freq: Once | ORAL | Status: AC
  Administered 2021-09-08: 40 meq via ORAL
  Filled 2021-09-08: qty 2

## 2021-09-08 MED ORDER — NOREPINEPHRINE 4 MG/250ML-% IV SOLN: 2.0000 ug/min | INTRAVENOUS | Status: DC

## 2021-09-08 MED ORDER — SODIUM CHLORIDE 0.9 % IV SOLN
3.0000 g | Freq: Four times a day (QID) | INTRAVENOUS | Status: AC
  Administered 2021-09-08 – 2021-09-13 (×17): 3 g via INTRAVENOUS
  Filled 2021-09-08 (×21): qty 8

## 2021-09-08 MED ORDER — VITAL 1.5 CAL PO LIQD
1000.0000 mL | ORAL | Status: DC
  Filled 2021-09-08 (×2): qty 1000

## 2021-09-08 MED ORDER — HEPARIN (PORCINE) 25000 UT/250ML-% IV SOLN
1550.0000 [IU]/h | INTRAVENOUS | Status: DC
  Administered 2021-09-08: 800 [IU]/h via INTRAVENOUS
  Administered 2021-09-09: 1200 [IU]/h via INTRAVENOUS
  Filled 2021-09-08 (×4): qty 250

## 2021-09-08 MED ORDER — CHLORHEXIDINE GLUCONATE 0.12% ORAL RINSE (MEDLINE KIT)
15.0000 mL | Freq: Two times a day (BID) | OROMUCOSAL | Status: DC
  Administered 2021-09-08 (×3): 15 mL via OROMUCOSAL

## 2021-09-08 MED ORDER — SODIUM CHLORIDE 0.9 % IV SOLN
1.0000 mg | Freq: Every day | INTRAVENOUS | Status: DC
  Administered 2021-09-08 – 2021-09-12 (×5): 1 mg via INTRAVENOUS
  Filled 2021-09-08 (×7): qty 0.2

## 2021-09-08 MED ORDER — IPRATROPIUM-ALBUTEROL 0.5-2.5 (3) MG/3ML IN SOLN: 3.0000 mL | Freq: Three times a day (TID) | RESPIRATORY_TRACT | Status: DC

## 2021-09-08 MED ORDER — THIAMINE HCL 100 MG/ML IJ SOLN
100.0000 mg | Freq: Every day | INTRAMUSCULAR | Status: DC
  Administered 2021-09-08 – 2021-09-12 (×5): 100 mg via INTRAVENOUS
  Filled 2021-09-08 (×5): qty 2

## 2021-09-08 MED ORDER — HEPARIN BOLUS VIA INFUSION
4000.0000 [IU] | Freq: Once | INTRAVENOUS | Status: AC
  Administered 2021-09-08: 4000 [IU] via INTRAVENOUS
  Filled 2021-09-08: qty 4000

## 2021-09-08 MED ORDER — HEPARIN BOLUS VIA INFUSION
1000.0000 [IU] | Freq: Once | INTRAVENOUS | Status: AC
Start: 2021-09-08 — End: 2021-09-08
  Administered 2021-09-08: 1000 [IU] via INTRAVENOUS
  Filled 2021-09-08: qty 1000

## 2021-09-08 MED ORDER — INSULIN ASPART 100 UNIT/ML IJ SOLN
1.0000 [IU] | INTRAMUSCULAR | Status: DC
  Administered 2021-09-08: 2 [IU] via SUBCUTANEOUS

## 2021-09-08 NOTE — Progress Notes (Signed)
PCCM INTERVAL PROGRESS NOTE   Called by RN for hypotension as well as lactic 3.5 and troponin 3300.   Start heparin infusion per pharmacy until ACS ruled out Repeat EKG  LR bolus for hypotension and will start norepinephrine peripherally if necessary to keep MAP >    Joneen Roach, AGACNP-BC [facility]  See Amion for personal pager PCCM on call pager [phone] until 7pm. Please call Elink 7p-7a. [phone]  09/08/2021 2:48 AM

## 2021-09-08 NOTE — Progress Notes (Signed)
Pt transported from Trauma A to 2H04 w/o complication. RT will cont to monitor as needed.

## 2021-09-08 NOTE — Progress Notes (Signed)
Pharmacy Antibiotic Note  Scott Grimes is a 61 y.o. male admitted on 09/07/2021 s/p cardiac arrest, concern for aspiration pneumonia.  Pharmacy has been consulted for Unasyn dosing.  Plan: Unasyn 3g IV Q6H.  Height: 5\' 11"  (180.3 cm) Weight: 68 kg (150 lb) IBW/kg (Calculated) : 75.3  Temp (24hrs), Avg:96.5 F (35.8 C), Min:96.5 F (35.8 C), Max:96.5 F (35.8 C)  Recent Labs  Lab 09/07/21 2325  WBC 11.2*  CREATININE 1.50*    Estimated Creatinine Clearance: 51.6 mL/min (A) (by C-G formula based on SCr of 1.5 mg/dL (H)).     Thank you for allowing pharmacy to be a part of this patient's care.  11/07/21, PharmD, BCPS  09/08/2021 12:35 AM

## 2021-09-08 NOTE — Progress Notes (Addendum)
[id number] Baas CCM, made aware that pts temp is up to 37.7, BPA popped up alerting that TTM needed to be started up after temp above 37.6. Per Mickel Baas and Dr. Silas Flood, order for TTM to be discontinued and to not start TTM, fever to be controlled with tylenol.   1015: Sister called, this RN unable to give updates at this time, CCM notified and will call sister to update.   1120: Dr. Silas Flood made aware of BP trends, new orders to be placed.

## 2021-09-08 NOTE — ED Notes (Addendum)
NP Hoffman made aware of BP, 2L LR ordered and levophed PRN, verbal order taken due to system downtime.

## 2021-09-08 NOTE — Progress Notes (Signed)
ANTICOAGULATION CONSULT NOTE - Initial Consult  Pharmacy Consult for heparin   Indication: chest pain/ACS  Not on File  Patient Measurements: Height: 5\' 11"  (180.3 cm) Weight: 68 kg (150 lb) IBW/kg (Calculated) : 75.3 Heparin Dosing Weight: 68 Kg  Vital Signs: Temp: 97.4 F (36.3 C) (06/08 0230) Temp Source: Temporal (06/07 2305) BP: 114/86 (06/08 0230) Pulse Rate: 124 (06/08 0230)  Labs: Recent Labs    09/07/21 2325 09/07/21 2350  HGB 15.1 12.2*  HCT 44.5 36.0*  PLT 368  --   LABPROT 12.9  --   INR 1.0  --   CREATININE 1.50*  --   TROPONINIHS 3,375*  --     Estimated Creatinine Clearance: 51.6 mL/min (A) (by C-G formula based on SCr of 1.5 mg/dL (H)).   Medical History: Past Medical History:  Diagnosis Date   COVID-19 02/2021   Low back pain    Stroke [facility])      Assessment: 60yoM  s/p CPR. No past medical history to indicate the need for anticoagulation prior to arrival and no mention of the patient taking anticoagulation at recent a doctors office visit. Initial CBC WNL. Pharmacy consulted to dose heparin while ACS ruled out.   Goal of Therapy:  Heparin level 0.3-0.7 units/ml Monitor platelets by anticoagulation protocol: Yes   Plan:  Give 4000 units bolus x 1 Start heparin infusion at 800 units/hr Check anti-Xa level in 6 hours and daily while on heparin Continue to monitor H&H and platelets  [facility], PharmD Clinical Pharmacist 09/08/2021 3:00 AM Please check AMION for all [facility] Pharmacy numbers

## 2021-09-08 NOTE — ED Notes (Signed)
Pt caregiver requesting updates when available [phone]. Caregiver requesting test and lab results.

## 2021-09-08 NOTE — Progress Notes (Signed)
EEG completed, results pending. 

## 2021-09-08 NOTE — ED Notes (Signed)
NP Hoffman made aware of critical lactic and troponin

## 2021-09-08 NOTE — Progress Notes (Signed)
ANTICOAGULATION CONSULT NOTE   Pharmacy Consult for heparin   Indication: chest pain/ACS  No Known Allergies  Patient Measurements: Height: 5\' 11"  (180.3 cm) Weight: 73.5 kg (162 lb 0.6 oz) IBW/kg (Calculated) : 75.3 Heparin Dosing Weight: 68 Kg  Vital Signs: Temp: 98.2 F (36.8 C) (06/08 1900) Temp Source: Bladder (06/08 1044) BP: 92/72 (06/08 1815) Pulse Rate: 55 (06/08 1948)  Labs: Recent Labs    09/07/21 2325 09/07/21 2350 09/08/21 0258 09/08/21 0558 09/08/21 0929 09/08/21 1904  HGB 15.1 12.2*  --  14.2  --   --   HCT 44.5 36.0*  --  40.8  --   --   PLT 368  --   --  297  --   --   LABPROT 12.9  --   --   --   --   --   INR 1.0  --   --   --   --   --   HEPARINUNFRC  --   --   --   --  0.17* 0.20*  CREATININE 1.50*  --   --  1.11  --   --   TROPONINIHS 3,375*  --  2,994*  --   --   --      Estimated Creatinine Clearance: 75.4 mL/min (by C-G formula based on SCr of 1.11 mg/dL).   Medical History: Past Medical History:  Diagnosis Date   COVID-19 02/2021   Low back pain    Stroke [facility])      Assessment: 60yoM  s/p CPR. No past medical history to indicate the need for anticoagulation prior to arrival and no mention of the patient taking anticoagulation at recent a doctors office visit. Pharmacy consulted to dose heparin as per ACS protocol.  Hgb, Hct, Plt stable Heparin level= 0.2 on 1050 units/hr No s/sx of bleeding  Goal of Therapy:  Heparin level 0.3-0.7 units/ml Monitor platelets by anticoagulation protocol: Yes   Plan:  -Heparin 1000 units IV bolus then increase infusion rate to 1200 units/hr -Heparin level in 6 hours and daily with CBC daily  Luisa Hart, PharmD, BCPS Clinical Pharmacist 09/08/2021 8:04 PM   Please refer to AMION for pharmacy phone number;a

## 2021-09-08 NOTE — Progress Notes (Signed)
ANTICOAGULATION CONSULT NOTE   Pharmacy Consult for heparin   Indication: chest pain/ACS  Not on File  Patient Measurements: Height: 5\' 11"  (180.3 cm) Weight: 73.5 kg (162 lb 0.6 oz) IBW/kg (Calculated) : 75.3 Heparin Dosing Weight: 68 Kg  Vital Signs: Temp: 100 F (37.8 C) (06/08 1044) Temp Source: Bladder (06/08 1044) BP: 103/60 (06/08 0930) Pulse Rate: 50 (06/08 1053)  Labs: Recent Labs    09/07/21 2325 09/07/21 2350 09/08/21 0258 09/08/21 0558 09/08/21 0929  HGB 15.1 12.2*  --  14.2  --   HCT 44.5 36.0*  --  40.8  --   PLT 368  --   --  297  --   LABPROT 12.9  --   --   --   --   INR 1.0  --   --   --   --   HEPARINUNFRC  --   --   --   --  0.17*  CREATININE 1.50*  --   --  1.11  --   TROPONINIHS 3,375*  --  2,994*  --   --      Estimated Creatinine Clearance: 75.4 mL/min (by C-G formula based on SCr of 1.11 mg/dL).   Medical History: Past Medical History:  Diagnosis Date   COVID-19 02/2021   Low back pain    Stroke [facility])      Assessment: 60yoM  s/p CPR. No past medical history to indicate the need for anticoagulation prior to arrival and no mention of the patient taking anticoagulation at recent a doctors office visit. Pharmacy consulted to dose heparin while ACS ruled out (he is noted cocaine positive) -heparin level= 0.17 on 800 units/hr  Goal of Therapy:  Heparin level 0.3-0.7 units/ml Monitor platelets by anticoagulation protocol: Yes   Plan:  -Heparin 2000 units IV bolus then increase to 1050 units/hr -Heparin level in 6 hours and daily wth CBC daily  Hildred Laser, PharmD Clinical Pharmacist **Pharmacist phone directory can now be found on [email or web address] ([email or web address]).  Listed under [place].  e

## 2021-09-08 NOTE — Progress Notes (Addendum)
NAME:  Scott Grimes, MRN:  [id number], DOB:  July 11, 1960, LOS: 0 ADMISSION DATE:  09/07/2021, CONSULTATION DATE:  09/07/21 REFERRING MD:  Dicie Beam, CHIEF COMPLAINT:  VT arrest   History of Present Illness:  Mr. Clementson is a 61 y/o gentleman with a history of LBP who had a witnessed seizure and remained unresponsive. When EMS arrived he was found to have VT then VF. CPR was initiated by EMS when they arrived and he underwent cardioversion with ROSC. He had another episode of VF in transport with cardioversion and ROSC. Lidocaine was administered by EMS as well as narcan. He was intubated in the ED; he was gagging too much to be intubated in the field. Per EMS he was never able to follow commands but was tracking with his eyes.   Per his caregiver, he has been smoking 3-4 ppd recently. In the past he has done drugs, including meth when he had a roommate who was also using meth. Tonight a friend of his who has a history of drugs came over. Later he had a bag of white powder with a straw under the ashtray where he was smoking. Shortly after while he was sitting in his chair, he reported to his caretaker "that didn't taste good; I'm not going to do that again", said he was not feeling well, then fell on the ground not gasping for air. She started CPR and called 911.  Pertinent  Medical History  Chronic back pain Tobacco abuse Drug abuse stroke On disability for intellectual disability  Significant Hospital Events: Including procedures, antibiotic start and stop dates in addition to other pertinent events   6/7 admitted  Interim History / Subjective:  NAEON. No pressors. Intermittently following commands. Passing SBT.  Objective   Blood pressure 101/78, pulse 64, temperature 98.4 F (36.9 C), temperature source Bladder, resp. rate (!) 21, height 5\' 11"  (1.803 m), weight 73.5 kg, SpO2 100 %.    Vent Mode: PSV;CPAP FiO2 (%):  [40 %-100 %] 40 % Set Rate:  [16 bmp-20 bmp] 16 bmp Vt Set:  [600  mL] 600 mL PEEP:  [5 cmH20] 5 cmH20 Pressure Support:  [5 cmH20] 5 cmH20 Plateau Pressure:  [13 cmH20-16 cmH20] 16 cmH20   Intake/Output Summary (Last 24 hours) at 09/08/2021 0834 Last data filed at 09/08/2021 0530 Gross per 24 hour  Intake 1000 ml  Output 350 ml  Net 650 ml    Filed Weights   09/07/21 2348 09/08/21 0500  Weight: 68 kg 73.5 kg    Examination: General: critically ill appearing man lying in bed in NAD HENT: [place]/AT, eyes anicteric Lungs: no wheezing, faint rhonchi, breathing over set rate Cardiovascular: S1S2, RRR Abdomen: soft, NT Extremities: cool feet, no peripheral edema or cyanosis Neuro: awakens to rub, does not follow commands for me Derm: warm, dry, no diffuse rashes  CXR personally reviewed> ETT ~7cm above carina, no opacities, normal cardiac silhouette, hyperinflated  CT head: no acute bleeding, no obvious masses  WBC 11.2>>10.2, nml hgb CMP with mild LFT elevation Ammonia 24  Resolved Hospital Problem list     Assessment & Plan:  Cardiac arrest- VT, VF- likley due to acute drug intoxication -amiodarone infusion -echo -monitor electrolytes, replete as needed  Acute respiratory failure with hypoxia, suspect obstructive lung disease  -LTVV, passing SBT hoping to extubate this AM -VAP prevention protocol -PAD protocol for sedation -daily SAT & SBT as appropriate -bronchodilators -Continue Unasyn for concern for aspiration, thick secretions -covid test negative  Acute encephalopathy,  concern for anoxic vs toxic encephalopathy. Following commands in AM, seems improved. -propofol -supportive care -UDS + for cocaine  Concern for seizure -ok to con't empiric keppra -consider EEG   AKI: Cr improving with fluids. Suspect hypovolemic. --encourage PO intake once extubated  NSTEMI: Troponin peaked ~3400. Related to arrest, cocaine ingestion. --Continue heparin plan 48 hours --Cardiology eval   Elevated LFTs: Suspect related to substance  abuse, NASH? --trend  Tobacco abuse -recommend cessation  At risk for malnutrition -TF -swallow eval once extubated   Best Practice (right click and "Reselect all SmartList Selections" daily)   Diet/type: tubefeeds DVT prophylaxis: prophylactic heparin  GI prophylaxis: PPI Lines: N/A Foley:  Yes, and it is still needed Code Status:  full code Last date of multidisciplinary goals of care discussion [6/8 with Son and Sister]  Labs   CBC: Recent Labs  Lab 09/07/21 2325 09/07/21 2350 09/08/21 0558  WBC 11.2*  --  10.2  NEUTROABS 4.6  --   --   HGB 15.1 12.2* 14.2  HCT 44.5 36.0* 40.8  MCV 98.5  --  93.4  PLT 368  --  297     Basic Metabolic Panel: Recent Labs  Lab 09/07/21 2325 09/07/21 2350 09/08/21 0558  NA 137 136 139  K 3.7 3.0* 3.8  CL 102  --  106  CO2 17*  --  22  GLUCOSE 222*  --  108*  BUN 13  --  11  CREATININE 1.50*  --  1.11  CALCIUM 8.7*  --  8.3*  MG 2.3  --  2.6*  PHOS 4.0  --  2.6   GFR: Estimated Creatinine Clearance: 75.4 mL/min (by C-G formula based on SCr of 1.11 mg/dL). Recent Labs  Lab 09/07/21 2325 09/08/21 0033 09/08/21 0558  WBC 11.2*  --  10.2  LATICACIDVEN 6.0* 3.5*  --      Liver Function Tests: Recent Labs  Lab 09/07/21 2325 09/08/21 0558  AST 88* 71*  ALT 58* 54*  ALKPHOS 137* 125  BILITOT 0.6 0.5  PROT 5.9* 6.0*  ALBUMIN 3.2* 3.2*   Recent Labs  Lab 09/07/21 2325  LIPASE 35   Recent Labs  Lab 09/07/21 2325  AMMONIA 24    ABG    Component Value Date/Time   PHART 7.448 09/07/2021 2350   PCO2ART 26.1 (L) 09/07/2021 2350   PO2ART 214 (H) 09/07/2021 2350   HCO3 18.1 (L) 09/07/2021 2350   TCO2 19 (L) 09/07/2021 2350   ACIDBASEDEF 5.0 (H) 09/07/2021 2350   O2SAT 100 09/07/2021 2350     Coagulation Profile: Recent Labs  Lab 09/07/21 2325  INR 1.0     Cardiac Enzymes: No results for input(s): "CKTOTAL", "CKMB", "CKMBINDEX", "TROPONINI" in the last 168 hours.  HbA1C: Hgb A1c MFr Bld   Date/Time Value Ref Range Status  09/08/2021 05:58 AM 5.3 4.8 - 5.6 % Final    Comment:    (NOTE) Pre diabetes:          5.7%-6.4%  Diabetes:              >6.4%  Glycemic control for   <7.0% adults with diabetes     CBG: Recent Labs  Lab 09/08/21 0032 09/08/21 0242 09/08/21 0543 09/08/21 0739  GLUCAP 188* 181* 97 84    Review of Systems:   Unable to be obtained due to intubated/ sedated.  Past Medical History:  He,  has a past medical history of COVID-19 (02/2021), Low back pain, and Stroke (HCC).  Surgical History:  History reviewed. No pertinent surgical history.   Social History:   reports that he has been smoking cigarettes. He does not have any smokeless tobacco history on file.   Family History:  His family history is not on file.   Allergies Not on File   Home Medications  Prior to Admission medications   Not on File     Critical care time:     CRITICAL CARE Performed by: Karren Burly   Total critical care time: 40 minutes  Critical care time was exclusive of separately billable procedures and treating other patients.  Critical care was necessary to treat or prevent imminent or life-threatening deterioration.  Critical care was time spent personally by me on the following activities: development of treatment plan with patient and/or surrogate as well as nursing, discussions with consultants, evaluation of patient's response to treatment, examination of patient, obtaining history from patient or surrogate, ordering and performing treatments and interventions, ordering and review of laboratory studies, ordering and review of radiographic studies, pulse oximetry and re-evaluation of patient's condition.   Karren Burly, MD  09/08/21 8:34 AM  Pulmonary & Critical Care

## 2021-09-08 NOTE — Progress Notes (Signed)
ETT advanced per MD order. Tube at 23 cm now at 27 per advancement order. RT will cont to monitor.

## 2021-09-08 NOTE — Procedures (Signed)
Patient Name: Scott Grimes  MRN: [id number]  Epilepsy Attending: Charlsie Quest  Referring Physician/Provider: Duayne Cal, NP  Date: 09/08/2021 Duration: 22.18 mins  Patient history: 61 year old male status post cardiac arrest.  EEG to evaluate for seizure.  Level of alertness: Lethargic  AEDs during EEG study: LEV, propofol  Technical aspects: This EEG study was done with scalp electrodes positioned according to the 10-20 International system of electrode placement. Electrical activity was acquired at a sampling rate of 500Hz  and reviewed with a high frequency filter of 70Hz  and a low frequency filter of 1Hz . EEG data were recorded continuously and digitally stored.   Description: EEG showed continuous generalized 5 to 7 Hz theta slowing admixed with intermittent generalized 2-3Hz  delta slowing. Hyperventilation and photic stimulation were not performed.     ABNORMALITY - Continuous slow, generalized  IMPRESSION: This study is suggestive of moderate to severe diffuse encephalopathy, nonspecific etiology. No seizures or epileptiform discharges were seen throughout the recording.  Matylda Fehring 

## 2021-09-08 NOTE — Progress Notes (Signed)
CSW received consult for patient. CSW informed by RN primary contact for patient identified. TOC will continue to follow for any additional needs.

## 2021-09-08 NOTE — Progress Notes (Signed)
Repeat EKG with persistent but improved lateral ST depressions, no TWI. Trop 3K Started on heparin gtt previously-- concern for cocaine induced NSTEMI.  Steffanie Dunn, DO 09/08/21 3:07 AM [place]

## 2021-09-08 NOTE — Progress Notes (Signed)
Initial Nutrition Assessment  DOCUMENTATION CODES:   Severe malnutrition in context of social or environmental circumstances  INTERVENTION:   Initiate tube feeding via OG tube: Vital 1.5 at 20 ml/h, increase by 10 ml every 8 hours to goal rate of 60 ml/h (1440 ml per day). Prosource TF 45 ml BID.  Provides 2240 kcal, 119 gm protein, 1100 ml free water daily.  Monitor magnesium, potassium, and phosphorus BID for at least 3 days, MD to replete as needed, as pt is at risk for refeeding syndrome given severe malnutrition.  NUTRITION DIAGNOSIS:   Severe Malnutrition related to social / environmental circumstances (drug abuse) as evidenced by severe muscle depletion, severe fat depletion.  GOAL:   Patient will meet greater than or equal to 90% of their needs  MONITOR:   Vent status, TF tolerance, Labs  REASON FOR ASSESSMENT:   Ventilator, Consult Enteral/tube feeding initiation and management  ASSESSMENT:   61 yo male admitted S/P witnessed seizure, VT then VF arrest (suspect r/t acute drug intoxication). PMH includes stroke, low back pain, COVID-19, tobacco abuse, drug abuse (meth), intellectual disability.  Received MD Consult for TF initiation and management. OG tube in place with tip in the stomach per x-ray.   Patient is currently intubated on ventilator support MV: 12 L/min Temp (24hrs), Avg:98 F (36.7 C), Min:95.6 F (35.3 C), Max:100 F (37.8 C)  Propofol: 6.1 ml/hr providing 161 kcal from lipid.  Labs reviewed.  UDS + for cocaine.  CBG: 84-106  Medications reviewed and include colace, novolog, miralax, fentanyl, keppra, propofol.  No recent weights available for review.   Patient meets criteria for severe malnutrition, given severe depletion of muscle and subcutaneous fat mass.  NUTRITION - FOCUSED PHYSICAL EXAM:  Flowsheet Row Most Recent Value  Orbital Region Severe depletion  Upper Arm Region Mild depletion  Thoracic and Lumbar Region Moderate  depletion  Buccal Region Severe depletion  Temple Region Severe depletion  Clavicle Bone Region Severe depletion  Clavicle and Acromion Bone Region Mild depletion  Scapular Bone Region Unable to assess  Dorsal Hand Unable to assess  Patellar Region Mild depletion  Anterior Thigh Region Mild depletion  Posterior Calf Region Mild depletion  Edema (RD Assessment) Mild  Hair Reviewed  Eyes Unable to assess  Mouth Unable to assess  Skin Reviewed  Nails Reviewed       Diet Order:   Diet Order     None       EDUCATION NEEDS:   Not appropriate for education at this time  Skin:  Skin Assessment: Reviewed RN Assessment  Last BM:  no BM documented  Height:   Ht Readings from Last 1 Encounters:  09/07/21 5\' 11"  (1.803 m)    Weight:   Wt Readings from Last 1 Encounters:  09/08/21 73.5 kg    Ideal Body Weight:  78.2 kg  BMI:  Body mass index is 22.6 kg/m.  Estimated Nutritional Needs:   Kcal:  2000-2200  Protein:  110-130 gm  Fluid:  2-2.2 L    Lucas Mallow RD, LDN, CNSC Please refer to Amion for contact information.

## 2021-09-08 NOTE — ED Notes (Signed)
Report given to Gardiner Barefoot, RN of 2H4

## 2021-09-08 NOTE — Procedures (Signed)
Extubation Procedure Note  Patient Details:   Name: [name] DOB: 08-May-1960 MRN: [id number]   Airway Documentation:    Vent end date: 09/08/21 Vent end time: 1730   Evaluation  O2 sats: stable throughout Complications: No apparent complications Patient did tolerate procedure well. Bilateral Breath Sounds: Clear, Diminished   Yes, pt could speak.  Pt extubated to 6 l/m [place].  Audrie Lia 09/08/2021, 5:32 PM

## 2021-09-09 ENCOUNTER — Inpatient Hospital Stay (HOSPITAL_COMMUNITY): Payer: Medicare Other

## 2021-09-09 DIAGNOSIS — G929 Unspecified toxic encephalopathy: Secondary | ICD-10-CM

## 2021-09-09 DIAGNOSIS — I519 Heart disease, unspecified: Secondary | ICD-10-CM

## 2021-09-09 DIAGNOSIS — R778 Other specified abnormalities of plasma proteins: Secondary | ICD-10-CM

## 2021-09-09 DIAGNOSIS — E43 Unspecified severe protein-calorie malnutrition: Secondary | ICD-10-CM | POA: Insufficient documentation

## 2021-09-09 DIAGNOSIS — I248 Other forms of acute ischemic heart disease: Secondary | ICD-10-CM | POA: Diagnosis present

## 2021-09-09 DIAGNOSIS — F14929 Cocaine use, unspecified with intoxication, unspecified: Secondary | ICD-10-CM

## 2021-09-09 LAB — BASIC METABOLIC PANEL
Anion gap: 4 — ABNORMAL LOW (ref 5–15)
BUN: 10 mg/dL (ref 6–20)
CO2: 26 mmol/L (ref 22–32)
Calcium: 8.2 mg/dL — ABNORMAL LOW (ref 8.9–10.3)
Chloride: 109 mmol/L (ref 98–111)
Creatinine, Ser: 0.94 mg/dL (ref 0.61–1.24)
GFR, Estimated: >60 mL/min (ref >60)
Glucose, Bld: 94 mg/dL (ref 70–99)
Potassium: 4.7 mmol/L (ref 3.5–5.1)
Sodium: 139 mmol/L (ref 135–145)

## 2021-09-09 LAB — CBC
HCT: 37.9 % — ABNORMAL LOW (ref 39.0–52.0)
Hemoglobin: 12.6 g/dL — ABNORMAL LOW (ref 13.0–17.0)
MCH: 31.8 pg (ref 26.0–34.0)
MCHC: 33.2 g/dL (ref 30.0–36.0)
MCV: 95.7 fL (ref 80.0–100.0)
Platelets: 251 10*3/uL (ref 150–400)
RBC: 3.96 MIL/uL — ABNORMAL LOW (ref 4.22–5.81)
RDW: 12.9 % (ref 11.5–15.5)
WBC: 10.4 10*3/uL (ref 4.0–10.5)
nRBC: 0 % (ref 0.0–0.2)

## 2021-09-09 LAB — HEPARIN LEVEL (UNFRACTIONATED)
Heparin Unfractionated: 0.39 IU/mL (ref 0.30–0.70)
Heparin Unfractionated: 0.32 IU/mL (ref 0.30–0.70)

## 2021-09-09 LAB — PHOSPHORUS: Phosphorus: 2.6 mg/dL (ref 2.5–4.6)

## 2021-09-09 LAB — HEPATIC FUNCTION PANEL
ALT: 40 U/L (ref 0–44)
AST: 44 U/L — ABNORMAL HIGH (ref 15–41)
Albumin: 2.9 g/dL — ABNORMAL LOW (ref 3.5–5.0)
Alkaline Phosphatase: 98 U/L (ref 38–126)
Bilirubin, Direct: <0.1 mg/dL (ref 0.0–0.2)
Total Bilirubin: 0.5 mg/dL (ref 0.3–1.2)
Total Protein: 5.6 g/dL — ABNORMAL LOW (ref 6.5–8.1)

## 2021-09-09 LAB — GLUCOSE, CAPILLARY
Glucose-Capillary: 98 mg/dL (ref 70–99)
Glucose-Capillary: 74 mg/dL (ref 70–99)
Glucose-Capillary: 81 mg/dL (ref 70–99)
Glucose-Capillary: 101 mg/dL — ABNORMAL HIGH (ref 70–99)
Glucose-Capillary: 85 mg/dL (ref 70–99)
Glucose-Capillary: 89 mg/dL (ref 70–99)

## 2021-09-09 LAB — MAGNESIUM: Magnesium: 2.3 mg/dL (ref 1.7–2.4)

## 2021-09-09 MED ORDER — ASPIRIN 81 MG PO CHEW
81.0000 mg | CHEWABLE_TABLET | Freq: Every day | ORAL | Status: DC
  Administered 2021-09-10 – 2021-09-16 (×7): 81 mg via ORAL
  Filled 2021-09-09 (×7): qty 1

## 2021-09-09 MED ORDER — ROSUVASTATIN CALCIUM 20 MG PO TABS
40.0000 mg | ORAL_TABLET | Freq: Every day | ORAL | Status: DC
  Administered 2021-09-09 – 2021-09-16 (×8): 40 mg via ORAL
  Filled 2021-09-09 (×9): qty 2

## 2021-09-09 MED ORDER — IPRATROPIUM-ALBUTEROL 0.5-2.5 (3) MG/3ML IN SOLN: 3.0000 mL | Freq: Four times a day (QID) | RESPIRATORY_TRACT | Status: DC | PRN

## 2021-09-09 MED ORDER — ORAL CARE MOUTH RINSE
15.0000 mL | Freq: Two times a day (BID) | OROMUCOSAL | Status: DC
  Administered 2021-09-09 – 2021-09-16 (×13): 15 mL via OROMUCOSAL

## 2021-09-09 MED ORDER — CHLORHEXIDINE GLUCONATE 0.12 % MT SOLN
15.0000 mL | Freq: Two times a day (BID) | OROMUCOSAL | Status: DC
  Administered 2021-09-09 – 2021-09-16 (×14): 15 mL via OROMUCOSAL
  Filled 2021-09-09 (×12): qty 15

## 2021-09-09 NOTE — Assessment & Plan Note (Signed)
VT cardiac arrest documented.  No recurrence on amiodarone.  Now in sinus bradycardia  EKG shows no clear evidence of ischemia or underlying predisposing disorder.   - Stop amiodarone now - Hold on further intervention.  - Cardiology consultation.

## 2021-09-09 NOTE — Progress Notes (Signed)
ANTICOAGULATION CONSULT NOTE   Pharmacy Consult for heparin   Indication: chest pain/ACS  No Known Allergies  Patient Measurements: Height: 5\' 11"  (180.3 cm) Weight: 73 kg (160 lb 15 oz) IBW/kg (Calculated) : 75.3 Heparin Dosing Weight: 68 Kg  Vital Signs: Temp: 98.8 F (37.1 C) (06/09 0900) BP: 92/65 (06/09 0900) Pulse Rate: 49 (06/09 0900)  Labs: Recent Labs    09/07/21 2325 09/07/21 2350 09/08/21 0258 09/08/21 0558 09/08/21 0929 09/08/21 1904 09/09/21 0141 09/09/21 0735  HGB 15.1 12.2*  --  14.2  --   --  12.6*  --   HCT 44.5 36.0*  --  40.8  --   --  37.9*  --   PLT 368  --   --  297  --   --  251  --   LABPROT 12.9  --   --   --   --   --   --   --   INR 1.0  --   --   --   --   --   --   --   HEPARINUNFRC  --   --   --   --    < > 0.20* 0.39 0.32  CREATININE 1.50*  --   --  1.11  --   --  0.94  --   TROPONINIHS 3,375*  --  2,994*  --   --   --   --   --    < > = values in this interval not displayed.     Estimated Creatinine Clearance: 88.4 mL/min (by C-G formula based on SCr of 0.94 mg/dL).   Medical History: Past Medical History:  Diagnosis Date   COVID-19 02/2021   Low back pain    Stroke [facility])      Assessment: 60yoM  s/p CPR. No past medical history to indicate the need for anticoagulation prior to arrival and no mention of the patient taking anticoagulation at recent a doctors office visit. Pharmacy consulted to dose heparin while ACS ruled out (he is noted cocaine positive) -heparin level= 0.32 on 1200 units/hr  Goal of Therapy:  Heparin level 0.3-0.7 units/ml Monitor platelets by anticoagulation protocol: Yes   Plan:  -Continue heparin 1200 units/hr -Heparin level  daily wth CBC daily  [facility], PharmD Clinical Pharmacist **Pharmacist phone directory can now be found on [email or web address] ([email or web address]).  Listed under [facility].

## 2021-09-09 NOTE — Assessment & Plan Note (Signed)
Moderate, but flat troponin rise with no ischemic changes on ECG.  Denies anginal symptoms.  Echo show RV and LV dysfunction. EF 45%  CTA chest negative for PE  - Cardiac arrest/drug related cardiomyopathy.  - Repeat limited echo prior to proceeding with invasive risk stratification - Start ASA, statin - compliance will be issue.  - Complete 48 h of heparin.

## 2021-09-09 NOTE — TOC Initial Note (Signed)
Transition of Care [facility]) - Initial/Assessment Note    Patient Details  Name: Scott Grimes MRN: [id number] Date of Birth: 1960-06-05  Transition of Care [facility]) CM/SW Contact:    Verdell Carmine, RN Phone Number: 09/09/2021, 2:37 PM  Clinical Narrative:                  Patient admitted to ICU with encephalopathy and cardiac arrest. Patient has history of cocaine use. Was positive for cocaine , EF 40% hypokinesis, Troponin elevated.  Plan: PT evaluation recommends CIR. CIR reviewed and is still too early in hospitalization to be definitive on tolerance, they will follow.  SA resources on patient instructions. TOC will follow for needs, recommendations,and transitions of care   Expected Discharge Plan: [place] Barriers to Discharge: Inadequate or no insurance   Patient Goals and CMS Choice        Expected Discharge Plan and Services Expected Discharge Plan: [place]   Discharge Planning Services: CM Consult   Living arrangements for the past 2 months: Single Family Home                                      Prior Living Arrangements/Services Living arrangements for the past 2 months: Single Family Home   Patient language and need for interpreter reviewed:: Yes        Need for Family Participation in Patient Care: Yes (Comment) Care giver support system in place?: Yes (comment)   Criminal Activity/Legal Involvement Pertinent to Current Situation/Hospitalization: No - Comment as needed  Activities of Daily Living      Permission Sought/Granted                  Emotional Assessment         Alcohol / Substance Use: Illicit Drugs Psych Involvement: No (comment)  Admission diagnosis:  Cardiac arrest [facility]) [I46.9] Patient Active Problem List   Diagnosis Date Noted   Protein-calorie malnutrition, severe 09/09/2021   Demand ischemia ([place]) 09/09/2021   Toxic encephalopathy 09/09/2021   Cocaine use with intoxication with  complication ([place]) [id number]   Cardiac arrest ([place]) 09/08/2021   PCP:  Pcp, No Pharmacy:   [facility] [place] [place] - [address] [place] [address] Phone: [phone] Fax: [phone]     Social Determinants of Health (SDOH) Interventions    Readmission Risk Interventions     No data to display

## 2021-09-09 NOTE — Progress Notes (Signed)
   Inpatient Rehab Admissions Coordinator :  Per therapy recommendations patient was screened for CIR candidacy by Oronde Hallenbeck RN MSN. Patient is not yet at a level to tolerate the intensity required to pursue a CIR admit . Patient may have the potential to progress to become a candidate. The CIR admissions team will follow and monitor for progress and place a Rehab Consult order if felt to be appropriate. Please contact me with any questions.  Katoya Amato RN MSN Admissions Coordinator [phone]  

## 2021-09-09 NOTE — Assessment & Plan Note (Signed)
Urine drug screen positive for cocaine.  Family reports long history of consumption of same and other drugs.   - Cocaine use can explain presentation.  - Long-term treatment of arrhythmia and seizure may not be indicated/effective in the context of drug intoxication

## 2021-09-09 NOTE — Assessment & Plan Note (Signed)
-   Encourage enteral nutrition

## 2021-09-09 NOTE — Consult Note (Addendum)
Cardiology Consultation:   Patient ID: VILLA HOLLRAH MRN: [id number]; DOB: April 17, 1960  Admit date: 09/07/2021 lDate of Consult: 09/09/2021  Primary Care Provider: Merryl Hacker No [facility] Cardiologist: None  [facility] Electrophysiologist:  None    Patient Profile:   Scott Grimes is a 61 y.o. male with a hx of low back pain, tobacco abuse, polysubstance abuse, CVA and disability for intellectual issues who is being seen today for the evaluation of cardiac arrest at the request of Kipp Brood, MD.  History of Present Illness:   Scott Grimes is a 61 year old male with a history of intellectual disability, chronic low back pain, polysubstance abuse with drugs and tobacco.  He was admitted yesterday with a witnessed seizure after which she remained unresponsive.  Apparently the patient had been smoking 3 to 4 packs of cigarettes daily recently and also had been doing drugs including meth and had a roommate who is using that as well.  Apparently the evening prior to admission a friend who was a drug abuser came over with a bag of white powder and a straw and the patient tried it and shortly after while he was sitting in the chair he reported that it did not taste good and then became unwell and collapsed to the ground.  His caregiver started CPR and called 911.  On arrival of EMS he was found to be in ventricular tachycardia which degenerated into ventricular fibrillation.  CPR was initiated by EMS and was shocked with ROSC.  He then had another episode of VT fib in route to the ER with ROSC after cardioversion.  He was started on IV lidocaine and was given Narcan.  Initial labs showed serum creatinine 0.94, sodium 139 potassium 4.7 mildly elevated AST at 44 and troponin elevated at 2994.  Lactate increased at 3.5.  Drug screen was positive for cocaine.  Chest CTA showed no evidence of PE.  There is diffuse peribronchial wall thickening possibly related to infectious or inflammatory process or  edema.  There is also minimal airspace and groundglass opacities in the bilateral lower lobes worrisome for infection.  Patient has underlying severe emphysema.  2D echo done yesterday showed mild LV dysfunction with EF 40 to 45% with anterior septal and inferior septal hypokinesis.  There was also interventricular septal flattening in systole and diastole consistent with RV pressure and volume overload.  He was severe RV hypokinesis with sparing of the apex and the right ventricular function was severely reduced.  There was biatrial enlargement as well.  Patient is now extubated and appears to be back to his baseline with mild mental cognition issues.  Cardiology is now asked to evaluate to rule out cardiac etiology of cardiac arrest although it is presumed that it was drug overdose related He tells me that he has never had any history of chest pain or pressure.  He does have underlying shortness of breath that he relates to his significant tobacco abuse.  He denies any history of palpitations recently although he says when he is done speed in the past he is notices heart rates.  Past Medical History:  Diagnosis Date   COVID-19 02/2021   Low back pain    Stroke [facility])     History reviewed. No pertinent surgical history.   Home Medications:  Prior to Admission medications   Medication Sig Start Date End Date Taking? Authorizing Provider  albuterol (VENTOLIN HFA) 108 (90 Base) MCG/ACT inhaler Inhale 4 puffs into the lungs every 4 (four) hours  as needed for shortness of breath. 02/01/21 02/01/22 Yes [provider]    Inpatient Medications: Scheduled Meds:  [START ON 09/10/2021] aspirin  81 mg Oral Daily   chlorhexidine  15 mL Mouth Rinse BID   Chlorhexidine Gluconate Cloth  6 each Topical Daily   docusate  100 mg Per Tube BID   feeding supplement (PROSource TF)  45 mL Per Tube BID   insulin aspart  1-3 Units Subcutaneous Q4H   mouth rinse  15 mL Mouth Rinse q12n4p   multivitamin  with minerals  1 tablet Oral Daily   polyethylene glycol  17 g Per Tube Daily   rosuvastatin  40 mg Oral Daily   thiamine injection  100 mg Intravenous Daily   Continuous Infusions:  sodium chloride Stopped (09/07/21 2337)   sodium chloride Stopped (09/08/21 1130)   ampicillin-sulbactam (UNASYN) IV Stopped (09/09/21 0544)   feeding supplement (VITAL 1.5 CAL)     folic acid 1 mg in sodium chloride 0.9 % 50 mL IVPB 1 mg (09/09/21 1049)   heparin 1,200 Units/hr (09/09/21 0900)   PRN Meds: sodium chloride, acetaminophen **OR** acetaminophen (TYLENOL) oral liquid 160 mg/5 mL **OR** acetaminophen, ipratropium-albuterol, LORazepam **OR** LORazepam, magnesium sulfate, ondansetron (ZOFRAN) IV  Allergies:   No Known Allergies  Social History:   Social History   Socioeconomic History   Marital status: Divorced    Spouse name: Not on file   Number of children: Not on file   Years of education: Not on file   Highest education level: Not on file  Occupational History   Not on file  Tobacco Use   Smoking status: Every Day    Types: Cigarettes   Smokeless tobacco: Not on file  Substance and Sexual Activity   Alcohol use: Not on file   Drug use: Not on file   Sexual activity: Not on file  Other Topics Concern   Not on file  Social History Narrative   Not on file   Social Determinants of Health   Financial Resource Strain: Not on file  Food Insecurity: Not on file  Transportation Needs: Not on file  Physical Activity: Not on file  Stress: Not on file  Social Connections: Not on file  Intimate Partner Violence: Not on file    Family History:   History reviewed. No pertinent family history.   ROS:  Please see the history of present illness.   All other ROS reviewed and negative.     Physical Exam/Data:   Vitals:   09/09/21 1000 09/09/21 1100 09/09/21 1143 09/09/21 1200  BP: 99/65     Pulse: (!) 49 61 (!) 58 (!) 58  Resp: 18 16 16 16   Temp: 99 F (37.2 C) 99.1 F (37.3  C) 98.1 F (36.7 C)   TempSrc:   Oral   SpO2: 99% 92% (!) 89% 95%  Weight:      Height:        Intake/Output Summary (Last 24 hours) at 09/09/2021 1251 Last data filed at 09/09/2021 1100 Gross per 24 hour  Intake 1876.26 ml  Output 995 ml  Net 881.26 ml      09/09/2021    5:00 AM 09/08/2021    5:00 AM 09/07/2021   11:48 PM  Last 3 Weights  Weight (lbs) 160 lb 15 oz 162 lb 0.6 oz 150 lb  Weight (kg) 73 kg 73.5 kg 68.04 kg     Body mass index is 22.45 kg/m.  General:  Well nourished, well developed,  in no acute distress HEENT: normal Lymph: no adenopathy Neck: no JVD Endocrine:  No thryomegaly Vascular: No carotid bruits; FA pulses 2+ bilaterally without bruits  Cardiac:  normal S1, S2; RRR; no murmur  Lungs:  clear to auscultation bilaterally, no wheezing, rhonchi or rales  Abd: soft, nontender, no hepatomegaly  Ext: no edema Musculoskeletal:  No deformities, BUE and BLE strength normal and equal Skin: warm and dry  Neuro:  CNs 2-12 intact, no focal abnormalities noted Psych:  Normal affect   EKG:  The initial EKG was personally reviewed and demonstrates: Sinus rhythm with incomplete right bundle branch block and millimeters ST segment depression in the anterolateral precordial leads.  Pete EKG showed normal sinus rhythm with incomplete right bundle branch block and nonspecific ST abnormality in the anterolateral precordial leads Telemetry:  Telemetry was personally reviewed and demonstrates: Sinus rhythm  Relevant CV Studies: 2D echo 09/08/2021 IMPRESSIONS    1. Anteroseptal and inferoseptal hypokinesis. Left ventricular ejection  fraction, by estimation, is 40 to 45%. The left ventricle has mildly  decreased function. The left ventricle demonstrates regional wall motion  abnormalities (see scoring  diagram/findings for description). Left ventricular diastolic parameters  are indeterminate. There is the interventricular septum is flattened in  systole and diastole,  consistent with right ventricular pressure and  volume overload.   2. McConnell's sign. Severe RV hypokinesis with sparing of the apex. .  Right ventricular systolic function is severely reduced. The right  ventricular size is mildly enlarged. There is normal pulmonary artery  systolic pressure.   3. Left atrial size was moderately dilated.   4. Right atrial size was moderately dilated.   5. The mitral valve is normal in structure. No evidence of mitral valve  regurgitation. No evidence of mitral stenosis.   6. The aortic valve is tricuspid. Aortic valve regurgitation is not  visualized. No aortic stenosis is present.   7. The inferior vena cava is dilated in size with <50% respiratory  variability, suggesting right atrial pressure of 15 mmHg.   Laboratory Data:  High Sensitivity Troponin:   Recent Labs  Lab 09/07/21 2325 09/08/21 0258  TROPONINIHS 3,375* 2,994*     Chemistry Recent Labs  Lab 09/07/21 2325 09/07/21 2350 09/08/21 0558 09/09/21 0141  NA 137 136 139 139  K 3.7 3.0* 3.8 4.7  CL 102  --  106 109  CO2 17*  --  22 26  GLUCOSE 222*  --  108* 94  BUN 13  --  11 10  CREATININE 1.50*  --  1.11 0.94  CALCIUM 8.7*  --  8.3* 8.2*  GFRNONAA 54*  --  >60 >60  ANIONGAP 18*  --  11 4*    Recent Labs  Lab 09/07/21 2325 09/08/21 0558 09/09/21 0141  PROT 5.9* 6.0* 5.6*  ALBUMIN 3.2* 3.2* 2.9*  AST 88* 71* 44*  ALT 58* 54* 40  ALKPHOS 137* 125 98  BILITOT 0.6 0.5 0.5   Hematology Recent Labs  Lab 09/07/21 2325 09/07/21 2350 09/08/21 0558 09/09/21 0141  WBC 11.2*  --  10.2 10.4  RBC 4.52  --  4.37 3.96*  HGB 15.1 12.2* 14.2 12.6*  HCT 44.5 36.0* 40.8 37.9*  MCV 98.5  --  93.4 95.7  MCH 33.4  --  32.5 31.8  MCHC 33.9  --  34.8 33.2  RDW 12.6  --  12.4 12.9  PLT 368  --  297 251   BNPNo results for input(s): "BNP", "PROBNP" in the last  168 hours.  DDimer No results for input(s): "DDIMER" in the last 168 hours.   Radiology/Studies:  CT Angio Chest  Pulmonary Embolism (PE) W or WO Contrast  Result Date: 09/08/2021 CLINICAL DATA:  Concern for PE on echo. EXAM: CT ANGIOGRAPHY CHEST WITH CONTRAST TECHNIQUE: Multidetector CT imaging of the chest was performed using the standard protocol during bolus administration of intravenous contrast. Multiplanar CT image reconstructions and MIPs were obtained to evaluate the vascular anatomy. RADIATION DOSE REDUCTION: This exam was performed according to the departmental dose-optimization program which includes automated exposure control, adjustment of the mA and/or kV according to patient size and/or use of iterative reconstruction technique. CONTRAST:  15mL OMNIPAQUE IOHEXOL 350 MG/ML SOLN COMPARISON:  None Available. FINDINGS: Cardiovascular: Satisfactory opacification of the pulmonary arteries to the segmental level. No evidence of pulmonary embolism. Normal heart size. No pericardial effusion. There are atherosclerotic calcifications of the coronary arteries. Mediastinum/Nodes: There is some mildly hyperdense fluid and stranding in the anterior mediastinum at the level of the heart. There are no enlarged mediastinal or hilar lymph nodes. The visualized thyroid gland is within normal limits. Small hiatal hernia is present. Lungs/Pleura: Severe emphysematous changes are present. There is diffuse peribronchial wall thickening, most significant in the right lower lobe. Trachea and central airways are patent. There is some patchy airspace disease and ground-glass opacity in the bilateral lung bases/lower lobes. There are trace bilateral pleural effusions. Upper Abdomen: No acute abnormality. Musculoskeletal: No chest wall abnormality. No acute or significant osseous findings. Review of the MIP images confirms the above findings. IMPRESSION: 1. No evidence for pulmonary embolism. 2. Small amount of fluid and stranding in the anterior mediastinum of uncertain etiology. Please correlate clinically for infection or resolving  anterior mediastinal hematoma. 3. Diffuse peribronchial wall thickening may be infectious/inflammatory or related to edema. 4. Minimal airspace and ground-glass opacities in the bilateral lower lobes worrisome for infection. 5. Trace bilateral pleural effusions. 6. Severe emphysema. Emphysema (ICD10-J43.9). Electronically Signed   By: Ronney Asters M.D.   On: 09/08/2021 21:34   ECHOCARDIOGRAM COMPLETE  Result Date: 09/08/2021    ECHOCARDIOGRAM REPORT   Patient Name:   KHIZER TOEWS Date of Exam: 09/08/2021 Medical Rec #:  [id number]        Height:       71.0 in Accession #:    [id number]       Weight:       162.0 lb Date of Birth:  04-Apr-1960        BSA:          1.928 m Patient Age:    44 years         BP:           101/78 mmHg Patient Gender: M                HR:           74 bpm. Exam Location:  Inpatient Procedure: 2D Echo, Cardiac Doppler and Color Doppler Indications:   Cardiac arrest  History:       Patient has no prior history of Echocardiogram examinations.                Stroke.  Sonographer:   [place] Referring      [phone] Julian Hy Phys:  Sonographer Comments: Technically challenging study due to limited acoustic windows and echo performed with patient supine and on artificial respirator. IMPRESSIONS  1. Anteroseptal and inferoseptal hypokinesis. Left ventricular ejection fraction,  by estimation, is 40 to 45%. The left ventricle has mildly decreased function. The left ventricle demonstrates regional wall motion abnormalities (see scoring diagram/findings for description). Left ventricular diastolic parameters are indeterminate. There is the interventricular septum is flattened in systole and diastole, consistent with right ventricular pressure and volume overload.  2. McConnell's sign. Severe RV hypokinesis with sparing of the apex. . Right ventricular systolic function is severely reduced. The right ventricular size is mildly enlarged. There is normal pulmonary artery systolic  pressure.  3. Left atrial size was moderately dilated.  4. Right atrial size was moderately dilated.  5. The mitral valve is normal in structure. No evidence of mitral valve regurgitation. No evidence of mitral stenosis.  6. The aortic valve is tricuspid. Aortic valve regurgitation is not visualized. No aortic stenosis is present.  7. The inferior vena cava is dilated in size with <50% respiratory variability, suggesting right atrial pressure of 15 mmHg. FINDINGS  Left Ventricle: Anteroseptal and inferoseptal hypokinesis. Left ventricular ejection fraction, by estimation, is 40 to 45%. The left ventricle has mildly decreased function. The left ventricle demonstrates regional wall motion abnormalities. The left ventricular internal cavity size was normal in size. There is no left ventricular hypertrophy. The interventricular septum is flattened in systole and diastole, consistent with right ventricular pressure and volume overload. Left ventricular diastolic parameters are indeterminate. Right Ventricle: McConnell's sign. Severe RV hypokinesis with sparing of the apex. The right ventricular size is mildly enlarged. No increase in right ventricular wall thickness. Right ventricular systolic function is severely reduced. There is normal pulmonary artery systolic pressure. The tricuspid regurgitant velocity is 2.13 m/s, and with an assumed right atrial pressure of 15 mmHg, the estimated right ventricular systolic pressure is [id number] mmHg. Left Atrium: Left atrial size was moderately dilated. Right Atrium: Right atrial size was moderately dilated. Prominent Eustachian valve. Pericardium: Trivial pericardial effusion is present. Mitral Valve: The mitral valve is normal in structure. No evidence of mitral valve regurgitation. No evidence of mitral valve stenosis. Tricuspid Valve: The tricuspid valve is normal in structure. Tricuspid valve regurgitation is mild . No evidence of tricuspid stenosis. Aortic Valve: The aortic  valve is tricuspid. Aortic valve regurgitation is not visualized. No aortic stenosis is present. Aortic valve mean gradient measures 3.0 mmHg. Aortic valve peak gradient measures 6.7 mmHg. Aortic valve area, by VTI measures 4.07 cm. Pulmonic Valve: The pulmonic valve was normal in structure. Pulmonic valve regurgitation is not visualized. No evidence of pulmonic stenosis. Aorta: The aortic root is normal in size and structure. Venous: The inferior vena cava is dilated in size with less than 50% respiratory variability, suggesting right atrial pressure of 15 mmHg. IAS/Shunts: No atrial level shunt detected by color flow Doppler. Additional Comments: McConnell's sign. Severe RV hypokinesis with sparing of the apex. Consider pulmonary embolism.  LEFT VENTRICLE PLAX 2D LVIDd:         4.30 cm     Diastology LVIDs:         3.50 cm     LV e' medial:  9.48 cm/s LV PW:         1.00 cm     LV e' lateral: 17.30 cm/s LV IVS:        0.90 cm LVOT diam:     2.40 cm LV SV:         93 LV SV Index:   48 LVOT Area:     4.52 cm  LV Volumes (MOD) LV vol d, MOD A4C: 61.2  ml LV vol s, MOD A4C: 42.1 ml LV SV MOD A4C:     61.2 ml RIGHT VENTRICLE RV Basal diam:  4.10 cm RV Mid diam:    4.20 cm RV S prime:     13.90 cm/s TAPSE (M-mode): 1.9 cm LEFT ATRIUM             Index        RIGHT ATRIUM           Index LA Vol (A2C):   69.5 ml 36.05 ml/m  RA Area:     17.50 cm LA Vol (A4C):   71.5 ml 37.08 ml/m  RA Volume:   47.30 ml  24.53 ml/m LA Biplane Vol: 72.8 ml 37.76 ml/m  AORTIC VALVE                    PULMONIC VALVE AV Area (Vmax):    3.86 cm     PV Vmax:       0.88 m/s AV Area (Vmean):   3.68 cm     PV Vmean:      60.750 cm/s AV Area (VTI):     4.07 cm     PV VTI:        0.194 m AV Vmax:           129.00 cm/s  PV Peak grad:  3.1 mmHg AV Vmean:          83.400 cm/s  PV Mean grad:  1.5 mmHg AV VTI:            0.228 m AV Peak Grad:      6.7 mmHg AV Mean Grad:      3.0 mmHg LVOT Vmax:         110.00 cm/s LVOT Vmean:        67.900 cm/s  LVOT VTI:          0.205 m LVOT/AV VTI ratio: 0.90  AORTA Ao Root diam: 3.40 cm TRICUSPID VALVE TR Peak grad:   18.1 mmHg TR Vmax:        213.00 cm/s  SHUNTS Systemic VTI:  0.20 m Systemic Diam: 2.40 cm Skeet Latch MD Electronically signed by Skeet Latch MD Signature Date/Time: 09/08/2021/3:40:58 PM    Final    EEG adult  Result Date: 09/08/2021 Lora Havens, MD     09/08/2021  9:20 AM Patient Name: KAELON FISHLER MRN: [id number] Epilepsy Attending: Lora Havens Referring Physician/Provider: Corey Harold, NP Date: 09/08/2021 Duration: 22.18 mins Patient history: 61 year old male status post cardiac arrest.  EEG to evaluate for seizure. Level of alertness: Lethargic AEDs during EEG study: LEV, propofol Technical aspects: This EEG study was done with scalp electrodes positioned according to the 10-20 International system of electrode placement. Electrical activity was acquired at a sampling rate of 500Hz  and reviewed with a high frequency filter of 70Hz  and a low frequency filter of 1Hz . EEG data were recorded continuously and digitally stored. Description: EEG showed continuous generalized 5 to 7 Hz theta slowing admixed with intermittent generalized 2-3Hz  delta slowing. Hyperventilation and photic stimulation were not performed.   ABNORMALITY - Continuous slow, generalized IMPRESSION: This study is suggestive of moderate to severe diffuse encephalopathy, nonspecific etiology. No seizures or epileptiform discharges were seen throughout the recording. Lora Havens   DG Abd Portable 1 View  Result Date: 09/08/2021 CLINICAL DATA:  OG tube placement. EXAM: PORTABLE ABDOMEN - 1 VIEW COMPARISON:  None Available. FINDINGS: Paucity of gas in  the small and large bowel. Small amount of air is noted in the rectum. An enteric tube terminates in the stomach. No radio-opaque calculi or other significant radiographic abnormality are seen. IMPRESSION: 1. Paucity of bowel gas in the small and large bowel  and small amount of gas in the rectum. Clinical correlation is recommended. 2. The enteric tube terminates in the stomach. Electronically Signed   By: Brett Fairy M.D.   On: 09/08/2021 00:33   DG Chest Portable 1 View  Result Date: 09/08/2021 CLINICAL DATA:  Endotracheal tube placement. EXAM: PORTABLE CHEST 1 VIEW COMPARISON:  09/07/2021. FINDINGS: The heart size and mediastinal contours are within normal limits. The visualized lungs are clear. In the right lateral lung is excluded from the field of view. No definite effusion or pneumothorax. No acute osseous abnormality. The endotracheal tube terminates 5.0 cm above the carina. An enteric tube terminates in the stomach. IMPRESSION: 1. No acute abnormality. 2. Medical devices as described above. Electronically Signed   By: Brett Fairy M.D.   On: 09/08/2021 00:29   CT Head Wo Contrast  Result Date: 09/07/2021 CLINICAL DATA:  Seizure disorder, clinical change.  Post CPR EXAM: CT HEAD WITHOUT CONTRAST TECHNIQUE: Contiguous axial images were obtained from the base of the skull through the vertex without intravenous contrast. RADIATION DOSE REDUCTION: This exam was performed according to the departmental dose-optimization program which includes automated exposure control, adjustment of the mA and/or kV according to patient size and/or use of iterative reconstruction technique. COMPARISON:  CT head 01/30/2021 BRAIN: BRAIN Brain: Patchy and confluent areas of decreased attenuation are noted throughout the deep and periventricular white matter of the cerebral hemispheres bilaterally, compatible with chronic microvascular ischemic disease. No evidence of large-territorial acute infarction. No parenchymal hemorrhage. No mass lesion. No extra-axial collection. No mass effect or midline shift. No hydrocephalus. Basilar cisterns are patent. Vascular: No hyperdense vessel. Skull: No acute fracture or focal lesion. Sinuses/Orbits: Mucosal thickening of the bilateral  maxillary, sphenoid, ethmoid sinuses. Paranasal sinuses and mastoid air cells are clear. The orbits are unremarkable. Other: Frothy secretion within the nasal cavities and nasopharynx. IMPRESSION: 1. No acute intracranial abnormality. 2. Frothy secretion within the nasal cavities and nasopharynx. Electronically Signed   By: Iven Finn M.D.   On: 09/07/2021 23:38   DG Chest Portable 1 View  Result Date: 09/07/2021 CLINICAL DATA:  Post CPR. EXAM: PORTABLE CHEST 1 VIEW COMPARISON:  Chest x-ray 01/30/2021 FINDINGS: Endotracheal tube tip is 6 cm above the carina. Enteric tube extends below the diaphragm. Lungs are clear. There is no pleural effusion or pneumothorax. No acute fractures are seen. Cardiomediastinal silhouette is within normal limits. IMPRESSION: 1. Endotracheal tube tip 6 cm above carina. 2. Enteric tube extends below the diaphragm. 3. The lungs are clear. Electronically Signed   By: Ronney Asters M.D.   On: 09/07/2021 23:13     Assessment and Plan:   Cardiac Arrest -presumed to be related to drug overdoes with cocaine and questionably got some tainted cocaine as he says it did not taste or feel right and then suddenly collapsed with seizure activity and remained unresponsive - immediate CPR started by his caregiver and shocked by EMS for ventricular tachycardia that degraded to V-fib with recurrent V-fib in route to the hospital again with 1 shock with ROSC. -Had any further arrhythmias on telemetry -He does have a history of tobacco abuse and given his age he is at risk of CAD. -At bedtime troponin elevated at 2994 but he  was defibrillated 2 times>>likely demand ischemia -Echo showed mild LV dysfunction with focal wall motion abnormalities and EF 45 to 50% so he does need some type of ischemic work-up even though it was presumed this was a cardiac arrest related to drug overdose. -IV amiodarone has been stopped -Chest CT negative for PE -Neurologically appears back to his baseline  with mild cognitive limitations -He needs further ischemic work-up given recent VT/VF cardiac arrest.  Even though this was done in the setting of cocaine use he has a significant history of severe tobacco abuse and likely has underlying CAD. -Continue aspirin, IV heparin drip and statin -No beta-blocker due to soft blood pressure, bradycardia and cocaine use  2.  LV dysfunction -LVF is mildly reduced with focal regional wall motion abnormalities in the anteroseptal and inferoseptal walls>>no prior echo to review -Cannot start beta-blocker due to hypotension, bradycardia and recent cocaine use and cannot use ACE/ARB/ARN I/MRA due to hypotension -Continue ASA 81 mg daily and statin therapy with Crestor 40 mg daily -FLP and ALT in a.m.     The patient is critically ill with multiple organ systems failure and requires high complexity decision making for assessment and support, frequent evaluation and titration of therapies, application of advanced monitoring technologies and extensive interpretation of multiple databases. Critical Care Time devoted to patient care services described in this note independent of APP time is 60 minutes with >50% of time spent in direct patient care.    For questions or updates, please contact [place] Please consult [email or web address] for contact info under    Signed, Fransico Him, MD  09/09/2021 12:51 PM

## 2021-09-09 NOTE — Progress Notes (Signed)
NAME:  Scott Grimes, MRN:  [id number], DOB:  1961/02/06, LOS: 1 ADMISSION DATE:  09/07/2021, CONSULTATION DATE:  09/07/21 REFERRING MD:  Dicie Beam, CHIEF COMPLAINT:  VT arrest   History of Present Illness:  Mr. Scott Grimes is a 61 y/o gentleman with a history of LBP who had a witnessed seizure and remained unresponsive. When EMS arrived he was found to have VT then VF. CPR was initiated by EMS when they arrived and he underwent cardioversion with ROSC. He had another episode of VF in transport with cardioversion and ROSC. Lidocaine was administered by EMS as well as narcan. He was intubated in the ED; he was gagging too much to be intubated in the field. Per EMS he was never able to follow commands but was tracking with his eyes.   Per his caregiver, he has been smoking 3-4 ppd recently. In the past he has done drugs, including meth when he had a roommate who was also using meth. Tonight a friend of his who has a history of drugs came over. Later he had a bag of white powder with a straw under the ashtray where he was smoking. Shortly after while he was sitting in his chair, he reported to his caretaker "that didn't taste good; I'm not going to do that again", said he was not feeling well, then fell on the ground not gasping for air. She started CPR and called 911.  Pertinent  Medical History  Chronic back pain Tobacco abuse Drug abuse stroke On disability for intellectual disability  Significant Hospital Events: Including procedures, antibiotic start and stop dates in addition to other pertinent events   6/7 admitted 6/8 extubated yesterday, off pressors, awake   Interim History / Subjective:  NAEON. No pressors. Intermittently following commands. Passing SBT.  Objective   Blood pressure 93/67, pulse (!) 49, temperature 98.8 F (37.1 C), resp. rate 13, height 5\' 11"  (1.803 m), weight 73 kg, SpO2 99 %.    Vent Mode: PRVC FiO2 (%):  [40 %-60 %] 44 % Set Rate:  [16 bmp] 16 bmp Vt Set:   [600 mL] 600 mL PEEP:  [5 cmH20] 5 cmH20 Pressure Support:  [5 cmH20] 5 cmH20 Plateau Pressure:  [5 cmH20] 5 cmH20   Intake/Output Summary (Last 24 hours) at 09/09/2021 11/09/2021 Last data filed at 09/09/2021 0600 Gross per 24 hour  Intake 3219.66 ml  Output 900 ml  Net 2319.66 ml    Filed Weights   09/07/21 2348 09/08/21 0500 09/09/21 0500  Weight: 68 kg 73.5 kg 73 kg    General:  fatigued-appearing M, resting in bed in no acute distress HEENT: MM pink/moist, sclera anicteric, pupils equal Neuro: awake, states name, slow to answer questions, moving extremities to command CV: s1s2 rrr, no m/r/g PULM:  good air movement on Kinder bilaterally, faint rhonchi, no tachypnea or accessory muscle yse GI: soft, non-tender and non-distended Extremities: warm/dry, no edema  Skin: no rashes or lesions  Labs reviewed WBC 10.4 Hgb 12.6 Creatinine 0.94 Ast 71->44    Resolved Hospital Problem list     Assessment & Plan:    Cardiac arrest- VT, VF- likley due to acute drug intoxication Likely NSTEMI likely from demand Minimal down time and now extubated, been in sinus rhythm and now normotensive -d/c amiodarone -EF 40-45% with regional wall motion abnormalities -Troponin peaked ~3400. Related to arrest, cocaine ingestion. -plan for heparin 48hrs and continue Asa, start statin and check lipid panel -cardiology consult   Acute respiratory failure with hypoxia,  suspect obstructive lung disease in the setting of arrest -extubated 6/8, stable on RA -bronchodilators -Continue Unasyn for concern for aspiration, thick secretions -covid test negative  Acute encephalopathy, concern for anoxic vs toxic encephalopathy  Improving Sister mentions baseline cognitive impairment and speech difficulties -continue supportive care -UDS + for cocaine -PT/OT  Concern for seizure -EEG negative, no apparent history, will d/c Keppra  AKI.  Suspect hypovolemic. Creatinine improved -continue to follow and  encourage po intake   Elevated LFTs: Suspect related to substance abuse, NASH? --trend, improving  Tobacco abuse -recommend cessation  At risk for malnutrition -swallow eval once extubated   Best Practice (right click and "Reselect all SmartList Selections" daily)   Diet/type: tubefeeds DVT prophylaxis: prophylactic heparin  GI prophylaxis: PPI Lines: N/A Foley:  Yes, and it is still needed Code Status:  full code Last date of multidisciplinary goals of care discussion [6/8 with Son and Sister]  Labs   CBC: Recent Labs  Lab 09/07/21 2325 09/07/21 2350 09/08/21 0558 09/09/21 0141  WBC 11.2*  --  10.2 10.4  NEUTROABS 4.6  --   --   --   HGB 15.1 12.2* 14.2 12.6*  HCT 44.5 36.0* 40.8 37.9*  MCV 98.5  --  93.4 95.7  PLT 368  --  297 251     Basic Metabolic Panel: Recent Labs  Lab 09/07/21 2325 09/07/21 2350 09/08/21 0558 09/09/21 0141  NA 137 136 139 139  K 3.7 3.0* 3.8 4.7  CL 102  --  106 109  CO2 17*  --  22 26  GLUCOSE 222*  --  108* 94  BUN 13  --  11 10  CREATININE 1.50*  --  1.11 0.94  CALCIUM 8.7*  --  8.3* 8.2*  MG 2.3  --  2.6* 2.3  PHOS 4.0  --  2.6 2.6    GFR: Estimated Creatinine Clearance: 88.4 mL/min (by C-G formula based on SCr of 0.94 mg/dL). Recent Labs  Lab 09/07/21 2325 09/08/21 0033 09/08/21 0558 09/09/21 0141  WBC 11.2*  --  10.2 10.4  LATICACIDVEN 6.0* 3.5*  --   --      Liver Function Tests: Recent Labs  Lab 09/07/21 2325 09/08/21 0558 09/09/21 0141  AST 88* 71* 44*  ALT 58* 54* 40  ALKPHOS 137* 125 98  BILITOT 0.6 0.5 0.5  PROT 5.9* 6.0* 5.6*  ALBUMIN 3.2* 3.2* 2.9*    Recent Labs  Lab 09/07/21 2325  LIPASE 35    Recent Labs  Lab 09/07/21 2325  AMMONIA 24     ABG    Component Value Date/Time   PHART 7.448 09/07/2021 2350   PCO2ART 26.1 (L) 09/07/2021 2350   PO2ART 214 (H) 09/07/2021 2350   HCO3 18.1 (L) 09/07/2021 2350   TCO2 19 (L) 09/07/2021 2350   ACIDBASEDEF 5.0 (H) 09/07/2021 2350    O2SAT 100 09/07/2021 2350     Coagulation Profile: Recent Labs  Lab 09/07/21 2325  INR 1.0     Cardiac Enzymes: No results for input(s): "CKTOTAL", "CKMB", "CKMBINDEX", "TROPONINI" in the last 168 hours.  HbA1C: Hgb A1c MFr Bld  Date/Time Value Ref Range Status  09/08/2021 05:58 AM 5.3 4.8 - 5.6 % Final    Comment:    (NOTE) Pre diabetes:          5.7%-6.4%  Diabetes:              >6.4%  Glycemic control for   <7.0% adults with diabetes  CBG: Recent Labs  Lab 09/08/21 1142 09/08/21 1550 09/08/21 1950 09/08/21 2323 09/09/21 0346  GLUCAP 106* 94 106* 94 98     Review of Systems:   Unable to be obtained due to intubated/ sedated.  Past Medical History:  He,  has a past medical history of COVID-19 (02/2021), Low back pain, and Stroke (HCC).   Surgical History:  History reviewed. No pertinent surgical history.   Social History:   reports that he has been smoking cigarettes. He does not have any smokeless tobacco history on file.   Family History:  His family history is not on file.   Allergies No Known Allergies   Home Medications  Prior to Admission medications   Not on File     Critical care time:     Darcella GasmanLaura R Audrey Thull, PA-C [facility] See Amion for pager If no response to pager , please call [phone] until 7pm After 7:00 pm call Elink  [phone]

## 2021-09-09 NOTE — Evaluation (Signed)
Physical Therapy Evaluation Patient Details Name: Scott Grimes MRN: [id number] DOB: 1961-02-27 Today's Date: 09/09/2021  History of Present Illness  Pt is a 61 y.o. male admitted 09/07/21 with witnessed seizure, remained unresponsive, found to have VT then VF by EMS requiring cardioversion. Pt with acute encephalopathy, concern for anoxic vs toxic encephalopathy; UDS (+) cocaine. ETT 6/7-6/8. EEG 6/8 suggestive of moderate to severe diffuse encephalopathy, nonspecific etiology; no seizures noted. PMH includes chronic back pain, polysubstance abuse, stroke, intellectual disability.   Clinical Impression  Pt presents with an overall decrease in functional mobility secondary to above. PTA, pt reports ambulatory with RW, lives alone; question reliable historian due to [place]; per chart, pt has caretaker, on disability. Today, pt requiring mod-maxA for standing mobility. Pt limited by generalized weakness, poor balance strategies/postural reactions and impaired cognition, including decreased attention and awareness. Hopeful pt's mobility will improve as cognition clears. Pt likely to require post-acute rehab, AIR vs SNF pending available support at d/c; expect pt could tolerate intensive AIR-level therapies if an option. Will follow acutely to address established goals.   Recommendations for follow up therapy are one component of a multi-disciplinary discharge planning process, led by the attending physician.  Recommendations may be updated based on patient status, additional functional criteria and insurance authorization.  Follow Up Recommendations Acute inpatient rehab (3hours/day)    Assistance Recommended at Discharge Frequent or constant Supervision/Assistance  Patient can return home with the following  A lot of help with walking and/or transfers;A lot of help with bathing/dressing/bathroom;Assistance with cooking/housework;Direct supervision/assist for medications management;Direct  supervision/assist for financial management;Assist for transportation;Help with stairs or ramp for entrance    Equipment Recommendations Wheelchair (measurements PT);Wheelchair cushion (measurements PT)  Recommendations for Other Services   Occupational Therapy, Speech Therapy    Functional Status Assessment Patient has had a recent decline in their functional status and demonstrates the ability to make significant improvements in function in a reasonable and predictable amount of time.     Precautions / Restrictions Precautions Precautions: Fall;Other (comment) Precaution Comments: Watch SpO2 (pt reports he does not wear baseline) Restrictions Weight Bearing Restrictions: No      Mobility  Bed Mobility Overal bed mobility: Needs Assistance Bed Mobility: Supine to Sit     Supine to sit: Mod assist, HOB elevated     General bed mobility comments: pt slow to initiate bed mobility despite multimodal cues; eventually able to come to sitting with modA for HHA to elevate trunk, minA to scoot hips to EOB    Transfers Overall transfer level: Needs assistance Equipment used: Rolling walker (2 wheels), 1 person hand held assist Transfers: Sit to/from Stand, Bed to chair/wheelchair/BSC Sit to Stand: Mod assist Stand pivot transfers: Max assist         General transfer comment: initial modA for trunk elevation standing from EOB to RW, pt not keeping grip on RW, heavy posterior lean, requiring multimodal cues to widen BOS and lean forward; return to sit, then modA to stand with HHA, maxA to complete stand pivot to recliner as pt not initiating steps and leaning posteriorly    Ambulation/Gait             Pre-gait activities: performed 2x side steps at EOB with RW and mod-maxA, heavy posterior lean and difficulty using RW    Stairs            Wheelchair Mobility    Modified Rankin (Stroke Patients Only)       Balance Overall balance assessment:  Needs  assistance Sitting-balance support: No upper extremity supported Sitting balance-Leahy Scale: Fair   Postural control: Left lateral lean, Posterior lean   Standing balance-Leahy Scale: Zero Standing balance comment: reliant on UE support and mod-maxA to maintain static standing balance; little to no balance strategies/postural reactions notes                             Pertinent Vitals/Pain Pain Assessment Pain Assessment: Faces Faces Pain Scale: Hurts a little bit Pain Location: "everywhere... I've hurt my whole life" Pain Descriptors / Indicators: Grimacing Pain Intervention(s): Monitored during session, Limited activity within patient's tolerance    Home Living Family/patient expects to be discharged to:: Private residence Living Arrangements: Alone               Home Equipment: Conservation officer, nature (2 wheels) Additional Comments: pt reports "I have no one" - per chart, has 'caretaker'    Prior Function Prior Level of Function : Patient poor historian/Family not available             Mobility Comments: pt reports ambulatory with RW, does not use w/c       Hand Dominance        Extremity/Trunk Assessment   Upper Extremity Assessment Upper Extremity Assessment: Generalized weakness;Difficult to assess due to impaired cognition    Lower Extremity Assessment Lower Extremity Assessment: Generalized weakness;Difficult to assess due to impaired cognition (increased tone)       Communication   Communication: Expressive difficulties  Cognition Arousal/Alertness: Lethargic Behavior During Therapy: Flat affect Overall Cognitive Status: No family/caregiver present to determine baseline cognitive functioning Area of Impairment: Orientation, Attention, Following commands, Safety/judgement, Awareness, Problem solving                 Orientation Level: Disoriented to, Place, Time, Situation Current Attention Level: Focused   Following Commands:  Follows one step commands inconsistently, Follows one step commands with increased time Safety/Judgement: Decreased awareness of safety, Decreased awareness of deficits Awareness: Intellectual Problem Solving: Slow processing, Decreased initiation, Difficulty sequencing, Requires verbal cues, Requires tactile cues General Comments: pt answering some questions appropriately, suspect unreliable historian; slow to respond and follow commands; speech difficult to understand at times (per RN, family reports this is baseline)        General Comments General comments (skin integrity, edema, etc.): Session performed on RA, difficulty getting reliable pulse ox reading (RN aware). notified RNs that pt at risk for hips sliding forward in chair, positioned to discourage this, but recommend keeping close tabs; chair alarm on    Exercises     Assessment/Plan    PT Assessment Patient needs continued PT services  PT Problem List Decreased strength;Decreased activity tolerance;Decreased balance;Decreased mobility;Decreased cognition;Decreased knowledge of use of DME;Decreased safety awareness;Cardiopulmonary status limiting activity;Impaired tone;Pain       PT Treatment Interventions DME instruction;Gait training;Stair training;Functional mobility training;Therapeutic activities;Therapeutic exercise;Balance training;Patient/family education;Cognitive remediation    PT Goals (Current goals can be found in the Care Plan section)  Acute Rehab PT Goals PT Goal Formulation: Patient unable to participate in goal setting Time For Goal Achievement: 09/23/21 Potential to Achieve Goals: Fair    Frequency Min 3X/week     Co-evaluation               AM-PAC PT "6 Clicks" Mobility  Outcome Measure Help needed turning from your back to your side while in a flat bed without using bedrails?: A Lot Help needed  moving from lying on your back to sitting on the side of a flat bed without using bedrails?: A  Lot Help needed moving to and from a bed to a chair (including a wheelchair)?: A Lot Help needed standing up from a chair using your arms (e.g., wheelchair or bedside chair)?: A Lot Help needed to walk in hospital room?: Total Help needed climbing 3-5 steps with a railing? : Total 6 Click Score: 10    End of Session Equipment Utilized During Treatment: Gait belt Activity Tolerance: Patient tolerated treatment well;Other (comment) (limited by AMS) Patient left: in chair;with call bell/phone within reach;with chair alarm set Nurse Communication: Mobility status PT Visit Diagnosis: Other abnormalities of gait and mobility (R26.89);Muscle weakness (generalized) (M62.81)    Time: [id number] PT Time Calculation (min) (ACUTE ONLY): 24 min   Charges:   PT Evaluation $PT Eval Moderate Complexity: 1 Mod        Mabeline Caras, PT, DPT Acute Rehabilitation Services  Pager [phone] Office [place] 09/09/2021, 12:19 PM

## 2021-09-09 NOTE — Progress Notes (Addendum)
Nutrition Follow-up  DOCUMENTATION CODES:   Severe malnutrition in context of social or environmental circumstances  INTERVENTION:   Mighty Shake (nectar thick) with meals   Encourage PO intake    NUTRITION DIAGNOSIS:   Severe Malnutrition related to social / environmental circumstances (drug abuse) as evidenced by severe muscle depletion, severe fat depletion. Ongoing.   GOAL:   Patient will meet greater than or equal to 90% of their needs Progressing with diet advancement   MONITOR:   Vent status, TF tolerance, Labs  REASON FOR ASSESSMENT:   Ventilator, Consult Enteral/tube feeding initiation and management  ASSESSMENT:   61 yo male admitted S/P witnessed seizure, VT then VF arrest (suspect r/t acute drug intoxication). PMH includes stroke, low back pain, COVID-19, tobacco abuse, drug abuse (meth), intellectual disability.  Spoke with RN, pt exubated.  SLP completed MBS and advanced diet.   Medications reviewed and include: colace, SSI, MVI with minerals, miralax, thiamine  Folic acid   Labs reviewed   Diet Order:   Diet Order             DIET DYS 3 Room service appropriate? Yes; Fluid consistency: Nectar Thick  Diet effective now                   EDUCATION NEEDS:   Not appropriate for education at this time  Skin:  Skin Assessment: Reviewed RN Assessment  Last BM:  no BM documented  Height:   Ht Readings from Last 1 Encounters:  09/07/21 5\' 11"  (1.803 m)    Weight:   Wt Readings from Last 1 Encounters:  09/09/21 73 kg    Ideal Body Weight:  78.2 kg  BMI:  Body mass index is 22.45 kg/m.  Estimated Nutritional Needs:   Kcal:  2000-2200  Protein:  110-130 gm  Fluid:  2-2.2 L  Jayleigh Notarianni P., RD, LDN, CNSC See AMiON for contact information

## 2021-09-09 NOTE — Assessment & Plan Note (Addendum)
Urine drug screen positive for cocaine.  Responses are slow.  Family reports borderline intellectual capacity.   - Avoid all sedatives and allow sensorium to clear.

## 2021-09-09 NOTE — Evaluation (Signed)
Clinical/Bedside Swallow Evaluation Patient Details  Name: Scott Grimes MRN: [id number] Date of Birth: [birth year]  Today's Date: 09/09/2021 Time: SLP Start Time (ACUTE ONLY): 1300 SLP Stop Time (ACUTE ONLY): 1312 SLP Time Calculation (min) (ACUTE ONLY): 12 min  Past Medical History:  Past Medical History:  Diagnosis Date   COVID-19 02/2021   Low back pain    Stroke [facility])    Past Surgical History: History reviewed. No pertinent surgical history. HPI:  Pt is a 61 year old male admitted with Cardiac arrest- VT, VF- likley due to acute drug intoxication. Likely NSTEMI likely from demand. Acute encephalopathy, concern for anoxic vs toxic encephalopathy. Sister mentions baseline cognitive impairment and speech difficulties. Intubated for one day 6/7- 6/8.    Assessment / Plan / Recommendation  Clinical Impression  Pt demonstrates immediate coughing with thin liquids, delayed coughing with nectar, though a baseline cough is also present given hx of smoking and emphysema. RN reports that reason for arrest is unknown and choking is a possibility. No family has been around the pt recently to report if pt has been coughing. Instrumental study is needed ASAP to hopefully prevent need for Cortrak. SLP Visit Diagnosis: Dysphagia, oropharyngeal phase (R13.12)    Aspiration Risk  Moderate aspiration risk           Recommendations for follow up therapy are one component of a multi-disciplinary discharge planning process, led by the attending physician.  Recommendations may be updated based on patient status, additional functional criteria and insurance authorization.   Prognosis        Swallow Study   General HPI: Pt is a 61 year old male admitted with Cardiac arrest- VT, VF- likley due to acute drug intoxication. Likely NSTEMI likely from demand. Acute encephalopathy, concern for anoxic vs toxic encephalopathy. Sister mentions baseline cognitive impairment and speech difficulties. Intubated  for one day 6/7- 6/8. Type of Study: Bedside Swallow Evaluation Diet Prior to this Study: NPO History of Recent Intubation: Yes Length of Intubations (days): 1 days Date extubated: 09/09/21 Behavior/Cognition: Alert;Cooperative;Pleasant mood Oral Cavity Assessment: Within Functional Limits Oral Care Completed by SLP: No Oral Cavity - Dentition: Edentulous Self-Feeding Abilities: Able to feed self Patient Positioning: Upright in chair Baseline Vocal Quality: Hoarse Volitional Cough: Strong;Congested Volitional Swallow: Able to elicit    Oral/Motor/Sensory Function Overall Oral Motor/Sensory Function: Within functional limits   Ice Chips Ice chips: Within functional limits   Thin Liquid Thin Liquid: Impaired Presentation: Cup;Spoon Pharyngeal  Phase Impairments: Cough - Immediate;Cough - Delayed    Nectar Thick Nectar Thick Liquid: Impaired Pharyngeal Phase Impairments: Cough - Delayed;Throat Clearing - Delayed   Honey Thick Honey Thick Liquid: Not tested   Puree Puree: Within functional limits Presentation: Spoon   Solid     Solid: Not tested      Lynann Beaver 09/09/2021,1:17 PM

## 2021-09-09 NOTE — Progress Notes (Signed)
Modified Barium Swallow Progress Note  Patient Details  Name: Scott Grimes MRN: [id number] Date of Birth: Jun 12, 1960  Today's Date: 09/09/2021  Modified Barium Swallow completed.  Full report located under Chart Review in the Imaging Section.  Brief recommendations include the following:  Clinical Impression  Pt demonstrates restlessness and poor positioning by the end of MBS. Initially pt was able to sip from a straw upright with thin liquids with no impairment. Pt slid himself to a more horizontal position, and liquids arrived deeper into the pharynx prior to swallowing with sensed aspiraiton (PAS7). Coughing elicited regurgitation from cervical esophagus. Transitioned to nectar thick liquids with success. Pt tolerated solids well despite missing dentition. Recommend nectar thick liquids and mechanical soft solids. Will f/u for tolerance.   Swallow Evaluation Recommendations       SLP Diet Recommendations: Nectar thick liquid;Dysphagia 3 (Mech soft) solids   Liquid Administration via: Cup;Straw   Medication Administration: Whole meds with liquid   Supervision: Patient able to self feed   Compensations: Slow rate;Small sips/bites       Oral Care Recommendations: Oral care BID        Scott Grimes, Katherene Ponto 09/09/2021,2:32 PM

## 2021-09-09 NOTE — Progress Notes (Signed)
ANTICOAGULATION CONSULT NOTE - Follow Up Consult  Pharmacy Consult for heparin Indication:  NSTEMI  Labs: Recent Labs    09/07/21 2325 09/07/21 2350 09/08/21 0258 09/08/21 0558 09/08/21 0929 09/08/21 1904 09/09/21 0141  HGB 15.1 12.2*  --  14.2  --   --  12.6*  HCT 44.5 36.0*  --  40.8  --   --  37.9*  PLT 368  --   --  297  --   --  251  LABPROT 12.9  --   --   --   --   --   --   INR 1.0  --   --   --   --   --   --   HEPARINUNFRC  --   --   --   --  0.17* 0.20* 0.39  CREATININE 1.50*  --   --  1.11  --   --   --   TROPONINIHS 3,375*  --  2,994*  --   --   --   --     Assessment/Plan:  61yo male therapeutic on heparin after rate change. Will continue infusion at current rate of 1200 units/hr and confirm stable with additional level.   Vernard Gambles, PharmD, BCPS  09/09/2021,2:55 AM

## 2021-09-09 NOTE — Discharge Instructions (Signed)
Intensive Outpatient Programs   [facility]                                  [facility] [address]                                                       [address] [place],  [place]                                                           [place], [place] [phone]                                                              [phone]   [facility]                [facility]         (Inpatient and outpatient)                                           [phone] (Suboxone and Methadone) [address]                                                                                                                 [phone]                                                                                                     [facility]                                               [facility] - Intensive Outpatient [address]                                                            [address] [place], [place] [address]                                                 [place], [place]  [phone]                                                              [phone]   [facility] (Outpatient, Inpatient, Chemical       [facility] (Groups and Residental) (insurance only) [phone]                                              [place], [place]                                                                                                             [phone]                                                    [facility]                                       [facility] (for caregivers and family) [address]                                                    [address] [place], [place]                                                          [place], [place] [phone]                                                               [phone]   Residential Treatment Programs   [facility]            [facility](2 years) Residential: 90 days)                 [facility] [address] [place]                                                            [address] [place], [place]                                                    [place], [place] [phone]                                                              [phone] or [phone]   [facility]                                              [facility] [address]                                                               [address] [place], [place]                                                          [place], [place] [phone]                                                              [phone]   [facility]                                [facility] [address]                                                           [address] [place], [place] [address]                                                   [place], [place] [phone]                                                              [phone] Admissions: 8am-3pm M-F   [facility]: Residential Program (90 Days)                     [facility]: [facility]  [place], [place]                                                    [place], [place]  [phone] or [phone]                                             (Walk in Hours over the weekend or by referral)     [facility]: [facility]:[phone] (for crisis response 24 hours a day)     Last Modified by Smith, Jessica, LCSWA at 12/18/20 1447    

## 2021-09-09 NOTE — Progress Notes (Signed)
CSW following to complete consult for substance use when appropriate. CSW will continue to follow.

## 2021-09-10 ENCOUNTER — Inpatient Hospital Stay (HOSPITAL_COMMUNITY): Payer: Medicare Other

## 2021-09-10 DIAGNOSIS — G929 Unspecified toxic encephalopathy: Secondary | ICD-10-CM

## 2021-09-10 DIAGNOSIS — R778 Other specified abnormalities of plasma proteins: Secondary | ICD-10-CM

## 2021-09-10 DIAGNOSIS — F14929 Cocaine use, unspecified with intoxication, unspecified: Secondary | ICD-10-CM

## 2021-09-10 DIAGNOSIS — I469 Cardiac arrest, cause unspecified: Secondary | ICD-10-CM | POA: Diagnosis not present

## 2021-09-10 DIAGNOSIS — E43 Unspecified severe protein-calorie malnutrition: Secondary | ICD-10-CM

## 2021-09-10 DIAGNOSIS — I248 Other forms of acute ischemic heart disease: Secondary | ICD-10-CM

## 2021-09-10 LAB — CBC
HCT: 34.1 % — ABNORMAL LOW (ref 39.0–52.0)
Hemoglobin: 11.2 g/dL — ABNORMAL LOW (ref 13.0–17.0)
MCH: 31.3 pg (ref 26.0–34.0)
MCHC: 32.8 g/dL (ref 30.0–36.0)
MCV: 95.3 fL (ref 80.0–100.0)
Platelets: 256 10*3/uL (ref 150–400)
RBC: 3.58 MIL/uL — ABNORMAL LOW (ref 4.22–5.81)
RDW: 12.8 % (ref 11.5–15.5)
WBC: 7.8 10*3/uL (ref 4.0–10.5)
nRBC: 0 % (ref 0.0–0.2)

## 2021-09-10 LAB — HEPATIC FUNCTION PANEL
ALT: 28 U/L (ref 0–44)
AST: 28 U/L (ref 15–41)
Albumin: 2.7 g/dL — ABNORMAL LOW (ref 3.5–5.0)
Alkaline Phosphatase: 81 U/L (ref 38–126)
Bilirubin, Direct: <0.1 mg/dL (ref 0.0–0.2)
Total Bilirubin: 0.4 mg/dL (ref 0.3–1.2)
Total Protein: 5.3 g/dL — ABNORMAL LOW (ref 6.5–8.1)

## 2021-09-10 LAB — BASIC METABOLIC PANEL
Anion gap: 3 — ABNORMAL LOW (ref 5–15)
BUN: 7 mg/dL (ref 6–20)
CO2: 28 mmol/L (ref 22–32)
Calcium: 8.2 mg/dL — ABNORMAL LOW (ref 8.9–10.3)
Chloride: 106 mmol/L (ref 98–111)
Creatinine, Ser: 1.04 mg/dL (ref 0.61–1.24)
GFR, Estimated: >60 mL/min (ref >60)
Glucose, Bld: 93 mg/dL (ref 70–99)
Potassium: 3.5 mmol/L (ref 3.5–5.1)
Sodium: 137 mmol/L (ref 135–145)

## 2021-09-10 LAB — LIPID PANEL
Cholesterol: 128 mg/dL (ref 0–200)
HDL: 30 mg/dL — ABNORMAL LOW (ref >40)
LDL Cholesterol: 74 mg/dL (ref 0–99)
Total CHOL/HDL Ratio: 4.3 RATIO
Triglycerides: 122 mg/dL (ref <150)
VLDL: 24 mg/dL (ref 0–40)

## 2021-09-10 LAB — GLUCOSE, CAPILLARY
Glucose-Capillary: 104 mg/dL — ABNORMAL HIGH (ref 70–99)
Glucose-Capillary: 108 mg/dL — ABNORMAL HIGH (ref 70–99)
Glucose-Capillary: 77 mg/dL (ref 70–99)
Glucose-Capillary: 79 mg/dL (ref 70–99)
Glucose-Capillary: 95 mg/dL (ref 70–99)

## 2021-09-10 LAB — MAGNESIUM: Magnesium: 1.9 mg/dL (ref 1.7–2.4)

## 2021-09-10 LAB — ECHOCARDIOGRAM LIMITED
Height: 71 in
Weight: 2609.6 oz

## 2021-09-10 LAB — HEPARIN LEVEL (UNFRACTIONATED)
Heparin Unfractionated: 0.16 IU/mL — ABNORMAL LOW (ref 0.30–0.70)
Heparin Unfractionated: 0.29 IU/mL — ABNORMAL LOW (ref 0.30–0.70)

## 2021-09-10 LAB — PHOSPHORUS: Phosphorus: 1.3 mg/dL — ABNORMAL LOW (ref 2.5–4.6)

## 2021-09-10 MED ORDER — ENOXAPARIN SODIUM 40 MG/0.4ML IJ SOSY
40.0000 mg | PREFILLED_SYRINGE | INTRAMUSCULAR | Status: DC
  Administered 2021-09-10 – 2021-09-15 (×6): 40 mg via SUBCUTANEOUS
  Filled 2021-09-10 (×6): qty 0.4

## 2021-09-10 MED ORDER — POTASSIUM PHOSPHATES 15 MMOLE/5ML IV SOLN
30.0000 mmol | Freq: Once | INTRAVENOUS | Status: AC
  Administered 2021-09-10: 30 mmol via INTRAVENOUS
  Filled 2021-09-10: qty 10

## 2021-09-10 MED ORDER — HEPARIN BOLUS VIA INFUSION
2000.0000 [IU] | Freq: Once | INTRAVENOUS | Status: AC
  Administered 2021-09-10: 2000 [IU] via INTRAVENOUS
  Filled 2021-09-10: qty 2000

## 2021-09-10 NOTE — Progress Notes (Signed)
  Echocardiogram 2D Echocardiogram has been performed.  Scott Grimes 09/10/2021, 10:01 AM

## 2021-09-10 NOTE — Progress Notes (Signed)
   09/10/21 1756  What Happened  Was fall witnessed? Yes  Who witnessed fall? Barnetta Chapel NT; Daivd Council RN ; Malen Gauze RN  Patients activity before fall to/from bed, chair, or stretcher  Was patient injured? No  Follow Up  MD notified Yes  Time MD notified Penn [place]ne  Family notified Yes - comment  Time family notified 1700  Additional tests No  Progress note created (see row info) Yes  Adult Fall Risk Assessment  Risk Factor Category (scoring not indicated) High fall risk per protocol (document High fall risk)  Age 61  Fall History: Fall within 6 months prior to admission 0  Elimination; Bowel and/or Urine Incontinence 0  Elimination; Bowel and/or Urine Urgency/Frequency 0  Medications: includes PCA/Opiates, Anti-convulsants, Anti-hypertensives, Diuretics, Hypnotics, Laxatives, Sedatives, and Psychotropics 3  Patient Care Equipment 2  Mobility-Assistance 2  Mobility-Gait 2  Mobility-Sensory Deficit 0  Altered awareness of immediate physical environment 1  Impulsiveness 2  Lack of understanding of one's physical/cognitive limitations 4  Total Score 16  Patient Fall Risk Level High fall risk  Adult Fall Risk Interventions  Required Bundle Interventions *See Row Information* High fall risk - low, moderate, and high requirements implemented  Additional Interventions Use of appropriate toileting equipment (bedpan, BSC, etc.)  Vitals  Temp 98.1 F (36.7 C)  Temp Source Oral  BP (!) 141/78  BP Location Left Arm  BP Method Automatic  Patient Position (if appropriate) Lying  Pulse Rate 76  Pulse Rate Source Monitor  ECG Heart Rate 77  Cardiac Rhythm NSR

## 2021-09-10 NOTE — Progress Notes (Signed)
ANTICOAGULATION CONSULT NOTE - Follow Up Consult  Pharmacy Consult for Heparin Indication: chest pain/ACS  No Known Allergies  Patient Measurements: Height: 5\' 11"  (180.3 cm) Weight: 74 kg (163 lb 1.6 oz) IBW/kg (Calculated) : 75.3 Heparin Dosing Weight: 68 kg  Vital Signs: Temp: 98.1 F (36.7 C) (06/10 0428) Temp Source: Oral (06/10 0428) BP: 113/75 (06/10 0428) Pulse Rate: 69 (06/10 0428)  Labs: Recent Labs    09/07/21 2325 09/07/21 2350 09/08/21 0258 09/08/21 0558 09/08/21 0929 09/09/21 0141 09/09/21 0735 09/10/21 0139 09/10/21 1029  HGB 15.1   < >  --  14.2  --  12.6*  --  11.2*  --   HCT 44.5   < >  --  40.8  --  37.9*  --  34.1*  --   PLT 368  --   --  297  --  251  --  256  --   LABPROT 12.9  --   --   --   --   --   --   --   --   INR 1.0  --   --   --   --   --   --   --   --   HEPARINUNFRC  --   --   --   --    < > 0.39 0.32 0.16* 0.29*  CREATININE 1.50*  --   --  1.11  --  0.94  --  1.04  --   TROPONINIHS 3,375*  --  2,994*  --   --   --   --   --   --    < > = values in this interval not displayed.    Estimated Creatinine Clearance: 81 mL/min (by C-G formula based on SCr of 1.04 mg/dL).   Medications:  Scheduled:   aspirin  81 mg Oral Daily   chlorhexidine  15 mL Mouth Rinse BID   Chlorhexidine Gluconate Cloth  6 each Topical Daily   docusate  100 mg Per Tube BID   feeding supplement (PROSource TF)  45 mL Per Tube BID   insulin aspart  1-3 Units Subcutaneous Q4H   mouth rinse  15 mL Mouth Rinse q12n4p   multivitamin with minerals  1 tablet Oral Daily   polyethylene glycol  17 g Per Tube Daily   rosuvastatin  40 mg Oral Daily   thiamine injection  100 mg Intravenous Daily   Infusions:   sodium chloride Stopped (09/07/21 2337)   sodium chloride Stopped (09/08/21 1130)   ampicillin-sulbactam (UNASYN) IV 3 g (09/10/21 0454)   folic acid 1 mg in sodium chloride 0.9 % 50 mL IVPB 1 mg (09/09/21 1049)   heparin 1,400 Units/hr (09/10/21 0452)     Assessment: 61 yo M s/p CPR. No past medical history to indicate the need for anticoagulation prior to arrival and no mention of the patient taking anticoagulation at recent a doctors office visit. Pharmacy consulted to for heparin dosing while ACS ruled out (he is noted cocaine positive).   Heparin level today is subtherapeutic at 0.29, on 1400 units/hr. Hgb 11.2, plt 256. No line issues or signs/symptoms of bleeding noted per RN.  Goal of Therapy:  Heparin level 0.3-0.7 units/ml Monitor platelets by anticoagulation protocol: Yes   Plan:  Increase IV heparin to 1550 units/hr. Check ~ 6 hr heparin level.  Daily CBC, heparin level. Monitor for signs/symptoms of bleeding.   Jerrilyn CairoAmanda Anyelo Mccue, PharmD PGY1 Pharmacy Resident Phone [phone] 09/10/2021 11:04 AM  Please check AMION for all [place] phone numbers After 10:00 PM, call [place] [phone]

## 2021-09-10 NOTE — Progress Notes (Signed)
ANTICOAGULATION CONSULT NOTE - Follow Up Consult  Pharmacy Consult for heparin Indication: ACS  Labs: Recent Labs    09/07/21 2325 09/07/21 2350 09/08/21 0258 09/08/21 0558 09/08/21 0929 09/09/21 0141 09/09/21 0735 09/10/21 0139  HGB 15.1   < >  --  14.2  --  12.6*  --  11.2*  HCT 44.5   < >  --  40.8  --  37.9*  --  34.1*  PLT 368  --   --  297  --  251  --  256  LABPROT 12.9  --   --   --   --   --   --   --   INR 1.0  --   --   --   --   --   --   --   HEPARINUNFRC  --   --   --   --    < > 0.39 0.32 0.16*  CREATININE 1.50*  --   --  1.11  --  0.94  --  1.04  TROPONINIHS 3,375*  --  2,994*  --   --   --   --   --    < > = values in this interval not displayed.    Assessment: 61yo male subtherapeutic on heparin after two levels at low end of goal though it had been trending down; no infusion issues or signs of bleeding per RN.  Goal of Therapy:  Heparin level 0.3-0.7 units/ml   Plan:  Will rebolus with heparin 2000 units and increase heparin infusion by 3 units/kg/hr to 1400 units/hr and check level in 6 hours.    Wynona Neat, PharmD, BCPS  09/10/2021,3:54 AM

## 2021-09-10 NOTE — Progress Notes (Signed)
Progress Note  Patient Name: Scott Grimes Date of Encounter: 09/10/2021  [facility] Cardiologist: Fransico Him, MD   Subjective   Doing well without complaint  Inpatient Medications    Scheduled Meds:  aspirin  81 mg Oral Daily   chlorhexidine  15 mL Mouth Rinse BID   Chlorhexidine Gluconate Cloth  6 each Topical Daily   docusate  100 mg Per Tube BID   feeding supplement (PROSource TF)  45 mL Per Tube BID   insulin aspart  1-3 Units Subcutaneous Q4H   mouth rinse  15 mL Mouth Rinse q12n4p   multivitamin with minerals  1 tablet Oral Daily   polyethylene glycol  17 g Per Tube Daily   rosuvastatin  40 mg Oral Daily   thiamine injection  100 mg Intravenous Daily   Continuous Infusions:  sodium chloride Stopped (09/07/21 2337)   sodium chloride Stopped (09/08/21 1130)   ampicillin-sulbactam (UNASYN) IV 3 g ([id number])   folic acid 1 mg in sodium chloride 0.9 % 50 mL IVPB 1 mg (09/09/21 1049)   heparin 1,400 Units/hr (09/10/21 0452)   PRN Meds: sodium chloride, acetaminophen **OR** acetaminophen (TYLENOL) oral liquid 160 mg/5 mL **OR** acetaminophen, ipratropium-albuterol, LORazepam **OR** LORazepam, magnesium sulfate, ondansetron (ZOFRAN) IV   Vital Signs    Vitals:   09/09/21 1623 09/09/21 2027 09/10/21 0018 09/10/21 0428  BP: 100/73 104/70 106/76 113/75  Pulse: (!) 58 60 65 69  Resp: 19 16 18 20   Temp: (!) 97.5 F (36.4 C) 98.2 F (36.8 C) 98.2 F (36.8 C) 98.1 F (36.7 C)  TempSrc: Oral Oral Oral Oral  SpO2: 96% 96% 93% 90%  Weight:    74 kg  Height:        Intake/Output Summary (Last 24 hours) at 09/10/2021 0856 Last data filed at 09/10/2021 0700 Gross per 24 hour  Intake 748.69 ml  Output 1450 ml  Net -701.31 ml      09/10/2021    4:28 AM 09/09/2021    5:00 AM 09/08/2021    5:00 AM  Last 3 Weights  Weight (lbs) 163 lb 1.6 oz 160 lb 15 oz 162 lb 0.6 oz  Weight (kg) 73.982 kg 73 kg 73.5 kg      Telemetry    Sinus rhythm- Personally  Reviewed  ECG    None new- Personally Reviewed  Physical Exam   GEN: No acute distress.   Neck: No JVD Cardiac: RRR, no murmurs, rubs, or gallops.  Respiratory: Clear to auscultation bilaterally. GI: Soft, nontender, non-distended  MS: No edema; No deformity. Neuro:  Nonfocal  Psych: Normal affect   Labs    High Sensitivity Troponin:   Recent Labs  Lab 09/07/21 2325 09/08/21 0258  TROPONINIHS 3,375* 2,994*     Chemistry Recent Labs  Lab 09/08/21 0558 09/09/21 0141 09/10/21 0139  NA 139 139 137  K 3.8 4.7 3.5  CL 106 109 106  CO2 22 26 28   GLUCOSE 108* 94 93  BUN 11 10 7   CREATININE 1.11 0.94 1.04  CALCIUM 8.3* 8.2* 8.2*  MG 2.6* 2.3 1.9  PROT 6.0* 5.6* 5.3*  ALBUMIN 3.2* 2.9* 2.7*  AST 71* 44* 28  ALT 54* 40 28  ALKPHOS 125 98 81  BILITOT 0.5 0.5 0.4  GFRNONAA >60 >60 >60  ANIONGAP 11 4* 3*    Lipids  Recent Labs  Lab 09/10/21 0139  CHOL 128  TRIG 122  HDL 30*  LDLCALC 74  CHOLHDL 4.3  Hematology Recent Labs  Lab 09/08/21 0558 09/09/21 0141 09/10/21 0139  WBC 10.2 10.4 7.8  RBC 4.37 3.96* 3.58*  HGB 14.2 12.6* 11.2*  HCT 40.8 37.9* 34.1*  MCV 93.4 95.7 95.3  MCH 32.5 31.8 31.3  MCHC 34.8 33.2 32.8  RDW 12.4 12.9 12.8  PLT 297 251 256   Thyroid  Recent Labs  Lab 09/07/21 2325  TSH 3.788    BNPNo results for input(s): "BNP", "PROBNP" in the last 168 hours.  DDimer No results for input(s): "DDIMER" in the last 168 hours.   Radiology    DG Swallowing Func-Speech Pathology  Result Date: 09/09/2021 Table formatting from the original result was not included. Images from the original result were not included. Objective Swallowing Evaluation: Type of Study: MBS-Modified Barium Swallow Study  Patient Details Name: MIR AMBER MRN: [id number] Date of Birth: 04/13/1960 Today's Date: 09/09/2021 Time: SLP Start Time (ACUTE ONLY): 1400 -SLP Stop Time (ACUTE ONLY): 1420 SLP Time Calculation (min) (ACUTE ONLY): 20 min Past Medical History:  Past Medical History: Diagnosis Date  COVID-19 02/2021  Low back pain   Stroke [facility])  Past Surgical History: No past surgical history on file. HPI: Pt is a 61 year old male admitted with Cardiac arrest- VT, VF- likley due to acute drug intoxication. Likely NSTEMI likely from demand. Acute encephalopathy, concern for anoxic vs toxic encephalopathy. Sister mentions baseline cognitive impairment and speech difficulties. Intubated for one day 6/7- 6/8.  No data recorded  Recommendations for follow up therapy are one component of a multi-disciplinary discharge planning process, led by the attending physician.  Recommendations may be updated based on patient status, additional functional criteria and insurance authorization. Assessment / Plan / Recommendation   09/09/2021   2:00 PM Clinical Impressions Clinical Impression Pt demonstrates restlessness and poor positioning by the end of MBS. Initially pt was able to sip from a straw upright with thin liquids with no impairment. Pt slid himself to a more horizontal position, and liquids arrived deeper into the pharynx prior to swallowing with sensed aspiraiton (PAS7). Coughing elicited regurgitation from cervical esophagus. Transitioned to nectar thick liquids with success. Pt tolerated solids well despite missing dentition. Recommend nectar thick liquids and mechanical soft solids. Janetta Vandoren f/u for tolerance. SLP Visit Diagnosis Dysphagia, oropharyngeal phase (R13.12) Impact on safety and function Mild aspiration risk     09/09/2021   2:00 PM Treatment Recommendations Treatment Recommendations Therapy as outlined in treatment plan below      No data to display      09/09/2021   2:00 PM Diet Recommendations SLP Diet Recommendations Nectar thick liquid;Dysphagia 3 (Mech soft) solids Liquid Administration via Cup;Straw Medication Administration Whole meds with liquid Compensations Slow rate;Small sips/bites     09/09/2021   2:00 PM Other Recommendations Oral Care Recommendations Oral care  BID    No data to display        09/09/2021   2:00 PM Oral Phase Oral Phase Impaired Oral - Nectar Straw WFL Oral - Thin Straw WFL Oral - Puree WFL Oral - Mech Soft [facility]    09/09/2021   2:00 PM Pharyngeal Phase Pharyngeal Phase Impaired Pharyngeal- Nectar Straw Delayed swallow initiation-pyriform sinuses Pharyngeal- Thin Cup Delayed swallow initiation-pyriform sinuses Pharyngeal- Thin Straw Delayed swallow initiation-vallecula;Delayed swallow initiation-pyriform sinuses;Penetration/Aspiration before swallow Pharyngeal Material enters airway, passes BELOW cords and not ejected out despite cough attempt by patient;Material does not enter airway Pharyngeal- Puree WFL Pharyngeal- Regular WFL     No data to display  DeBlois, Katherene Ponto 09/09/2021, 2:38 PM                     CT Angio Chest Pulmonary Embolism (PE) W or WO Contrast  Result Date: 09/08/2021 CLINICAL DATA:  Concern for PE on echo. EXAM: CT ANGIOGRAPHY CHEST WITH CONTRAST TECHNIQUE: Multidetector CT imaging of the chest was performed using the standard protocol during bolus administration of intravenous contrast. Multiplanar CT image reconstructions and MIPs were obtained to evaluate the vascular anatomy. RADIATION DOSE REDUCTION: This exam was performed according to the departmental dose-optimization program which includes automated exposure control, adjustment of the mA and/or kV according to patient size and/or use of iterative reconstruction technique. CONTRAST:  178mL OMNIPAQUE IOHEXOL 350 MG/ML SOLN COMPARISON:  None Available. FINDINGS: Cardiovascular: Satisfactory opacification of the pulmonary arteries to the segmental level. No evidence of pulmonary embolism. Normal heart size. No pericardial effusion. There are atherosclerotic calcifications of the coronary arteries. Mediastinum/Nodes: There is some mildly hyperdense fluid and stranding in the anterior mediastinum at the level of the heart. There are no enlarged mediastinal or hilar lymph nodes.  The visualized thyroid gland is within normal limits. Small hiatal hernia is present. Lungs/Pleura: Severe emphysematous changes are present. There is diffuse peribronchial wall thickening, most significant in the right lower lobe. Trachea and central airways are patent. There is some patchy airspace disease and ground-glass opacity in the bilateral lung bases/lower lobes. There are trace bilateral pleural effusions. Upper Abdomen: No acute abnormality. Musculoskeletal: No chest wall abnormality. No acute or significant osseous findings. Review of the MIP images confirms the above findings. IMPRESSION: 1. No evidence for pulmonary embolism. 2. Small amount of fluid and stranding in the anterior mediastinum of uncertain etiology. Please correlate clinically for infection or resolving anterior mediastinal hematoma. 3. Diffuse peribronchial wall thickening may be infectious/inflammatory or related to edema. 4. Minimal airspace and ground-glass opacities in the bilateral lower lobes worrisome for infection. 5. Trace bilateral pleural effusions. 6. Severe emphysema. Emphysema (ICD10-J43.9). Electronically Signed   By: Ronney Asters M.D.   On: 09/08/2021 21:34   ECHOCARDIOGRAM COMPLETE  Result Date: 09/08/2021    ECHOCARDIOGRAM REPORT   Patient Name:   JADIEN CUGINI Date of Exam: 09/08/2021 Medical Rec #:  [id number]        Height:       71.0 in Accession #:    [id number]       Weight:       162.0 lb Date of Birth:  1960-05-12        BSA:          1.928 m Patient Age:    65 years         BP:           101/78 mmHg Patient Gender: M                HR:           74 bpm. Exam Location:  Inpatient Procedure: 2D Echo, Cardiac Doppler and Color Doppler Indications:   Cardiac arrest  History:       Patient has no prior history of Echocardiogram examinations.                Stroke.  Sonographer:   [place] Referring      [phone] Julian Hy Phys:  Sonographer Comments: Technically challenging study due to limited  acoustic windows and echo performed with patient supine and on artificial respirator. IMPRESSIONS  1.  Anteroseptal and inferoseptal hypokinesis. Left ventricular ejection fraction, by estimation, is 40 to 45%. The left ventricle has mildly decreased function. The left ventricle demonstrates regional wall motion abnormalities (see scoring diagram/findings for description). Left ventricular diastolic parameters are indeterminate. There is the interventricular septum is flattened in systole and diastole, consistent with right ventricular pressure and volume overload.  2. McConnell's sign. Severe RV hypokinesis with sparing of the apex. . Right ventricular systolic function is severely reduced. The right ventricular size is mildly enlarged. There is normal pulmonary artery systolic pressure.  3. Left atrial size was moderately dilated.  4. Right atrial size was moderately dilated.  5. The mitral valve is normal in structure. No evidence of mitral valve regurgitation. No evidence of mitral stenosis.  6. The aortic valve is tricuspid. Aortic valve regurgitation is not visualized. No aortic stenosis is present.  7. The inferior vena cava is dilated in size with <50% respiratory variability, suggesting right atrial pressure of 15 mmHg. FINDINGS  Left Ventricle: Anteroseptal and inferoseptal hypokinesis. Left ventricular ejection fraction, by estimation, is 40 to 45%. The left ventricle has mildly decreased function. The left ventricle demonstrates regional wall motion abnormalities. The left ventricular internal cavity size was normal in size. There is no left ventricular hypertrophy. The interventricular septum is flattened in systole and diastole, consistent with right ventricular pressure and volume overload. Left ventricular diastolic parameters are indeterminate. Right Ventricle: McConnell's sign. Severe RV hypokinesis with sparing of the apex. The right ventricular size is mildly enlarged. No increase in right  ventricular wall thickness. Right ventricular systolic function is severely reduced. There is normal pulmonary artery systolic pressure. The tricuspid regurgitant velocity is 2.13 m/s, and with an assumed right atrial pressure of 15 mmHg, the estimated right ventricular systolic pressure is [id number] mmHg. Left Atrium: Left atrial size was moderately dilated. Right Atrium: Right atrial size was moderately dilated. Prominent Eustachian valve. Pericardium: Trivial pericardial effusion is present. Mitral Valve: The mitral valve is normal in structure. No evidence of mitral valve regurgitation. No evidence of mitral valve stenosis. Tricuspid Valve: The tricuspid valve is normal in structure. Tricuspid valve regurgitation is mild . No evidence of tricuspid stenosis. Aortic Valve: The aortic valve is tricuspid. Aortic valve regurgitation is not visualized. No aortic stenosis is present. Aortic valve mean gradient measures 3.0 mmHg. Aortic valve peak gradient measures 6.7 mmHg. Aortic valve area, by VTI measures 4.07 cm. Pulmonic Valve: The pulmonic valve was normal in structure. Pulmonic valve regurgitation is not visualized. No evidence of pulmonic stenosis. Aorta: The aortic root is normal in size and structure. Venous: The inferior vena cava is dilated in size with less than 50% respiratory variability, suggesting right atrial pressure of 15 mmHg. IAS/Shunts: No atrial level shunt detected by color flow Doppler. Additional Comments: McConnell's sign. Severe RV hypokinesis with sparing of the apex. Consider pulmonary embolism.  LEFT VENTRICLE PLAX 2D LVIDd:         4.30 cm     Diastology LVIDs:         3.50 cm     LV e' medial:  9.48 cm/s LV PW:         1.00 cm     LV e' lateral: 17.30 cm/s LV IVS:        0.90 cm LVOT diam:     2.40 cm LV SV:         93 LV SV Index:   48 LVOT Area:     4.52 cm  LV Volumes (  MOD) LV vol d, MOD A4C: 61.2 ml LV vol s, MOD A4C: 42.1 ml LV SV MOD A4C:     61.2 ml RIGHT VENTRICLE RV Basal diam:   4.10 cm RV Mid diam:    4.20 cm RV S prime:     13.90 cm/s TAPSE (M-mode): 1.9 cm LEFT ATRIUM             Index        RIGHT ATRIUM           Index LA Vol (A2C):   69.5 ml 36.05 ml/m  RA Area:     17.50 cm LA Vol (A4C):   71.5 ml 37.08 ml/m  RA Volume:   47.30 ml  24.53 ml/m LA Biplane Vol: 72.8 ml 37.76 ml/m  AORTIC VALVE                    PULMONIC VALVE AV Area (Vmax):    3.86 cm     PV Vmax:       0.88 m/s AV Area (Vmean):   3.68 cm     PV Vmean:      60.750 cm/s AV Area (VTI):     4.07 cm     PV VTI:        0.194 m AV Vmax:           129.00 cm/s  PV Peak grad:  3.1 mmHg AV Vmean:          83.400 cm/s  PV Mean grad:  1.5 mmHg AV VTI:            0.228 m AV Peak Grad:      6.7 mmHg AV Mean Grad:      3.0 mmHg LVOT Vmax:         110.00 cm/s LVOT Vmean:        67.900 cm/s LVOT VTI:          0.205 m LVOT/AV VTI ratio: 0.90  AORTA Ao Root diam: 3.40 cm TRICUSPID VALVE TR Peak grad:   18.1 mmHg TR Vmax:        213.00 cm/s  SHUNTS Systemic VTI:  0.20 m Systemic Diam: 2.40 cm Skeet Latch MD Electronically signed by Skeet Latch MD Signature Date/Time: 09/08/2021/3:40:58 PM    Final    EEG adult  Result Date: 09/08/2021 Lora Havens, MD     09/08/2021  9:20 AM Patient Name: MAXIMILLION AVENI MRN: [id number] Epilepsy Attending: Lora Havens Referring Physician/Provider: Corey Harold, NP Date: 09/08/2021 Duration: 22.18 mins Patient history: 61 year old male status post cardiac arrest.  EEG to evaluate for seizure. Level of alertness: Lethargic AEDs during EEG study: LEV, propofol Technical aspects: This EEG study was done with scalp electrodes positioned according to the 10-20 International system of electrode placement. Electrical activity was acquired at a sampling rate of 500Hz  and reviewed with a high frequency filter of 70Hz  and a low frequency filter of 1Hz . EEG data were recorded continuously and digitally stored. Description: EEG showed continuous generalized 5 to 7 Hz theta slowing admixed  with intermittent generalized 2-3Hz  delta slowing. Hyperventilation and photic stimulation were not performed.   ABNORMALITY - Continuous slow, generalized IMPRESSION: This study is suggestive of moderate to severe diffuse encephalopathy, nonspecific etiology. No seizures or epileptiform discharges were seen throughout the recording. Lora Havens    Cardiac Studies   TTE 09/08/21  1. Anteroseptal and inferoseptal hypokinesis. Left ventricular ejection  fraction, by estimation, is 40  to 45%. The left ventricle has mildly  decreased function. The left ventricle demonstrates regional wall motion  abnormalities (see scoring  diagram/findings for description). Left ventricular diastolic parameters  are indeterminate. There is the interventricular septum is flattened in  systole and diastole, consistent with right ventricular pressure and  volume overload.   2. McConnell's sign. Severe RV hypokinesis with sparing of the apex. .  Right ventricular systolic function is severely reduced. The right  ventricular size is mildly enlarged. There is normal pulmonary artery  systolic pressure.   3. Left atrial size was moderately dilated.   4. Right atrial size was moderately dilated.   5. The mitral valve is normal in structure. No evidence of mitral valve  regurgitation. No evidence of mitral stenosis.   6. The aortic valve is tricuspid. Aortic valve regurgitation is not  visualized. No aortic stenosis is present.   7. The inferior vena cava is dilated in size with <50% respiratory  variability, suggesting right atrial pressure of 15 mmHg.   Patient Profile     61 y.o. male with history of tobacco use, polysubstance abuse, intellectual disabilities to the hospital with cardiac arrest likely due to cocaine abuse.  Assessment & Plan    Cardiac arrest: Thought due to drug abuse with cocaine in his system.  No further arrhythmias on telemetry.  Troponin was significantly elevated and he has LV  systolic dysfunction.  Neurologically appears to be back to his baseline.  Continue aspirin, heparin, statin.  Holding off on beta-blockers due to recent cocaine abuse.  LV systolic dysfunction: Found in the reduced with focal wall motion abnormalities.  Echo already ordered for today.  If ejection fraction remains low, Christia Coaxum need ischemic evaluation.     For questions or updates, please contact [place] Please consult [email or web address] for contact info under        Signed, Gerson Fauth Meredith Leeds, MD  09/10/2021, 8:56 AM

## 2021-09-10 NOTE — Progress Notes (Signed)
Progress Note  Patient: Scott Grimes [id number] DOB: 1960-10-08  DOA: 09/07/2021  DOS: 09/10/2021    Brief hospital course: Scott Grimes is a 61 y.o. male with a history of cocaine use, intellectual disability who presented by EMS to the ED on 6/7 after sustaining VT, then VF cardiac arrest after a witnessed seizure with ROSC after CPR and cardioversion. Lidocaine started by EMS and patient intubated in ED  Assessment and Plan: VT OOH cardiac arrest: Due to cocaine use. No recurrence on amiodarone gtt, now in sinus bradycardia.  - Cardiology consulted. No further management recommended aside from cocaine cessation.  Acute systolic CHF: Improved by serial echocardiograms. No further work up or treatment recommended by cardiology.   Demand myocardial ischemia, type 2 NSTEMI:  - Repeat echo today, per report, d/w Dr. Curt Bears, shows improved LVEF ~60-65% with basal inferior hypokinesis. Defer any further work up to outpatient setting.  - Completed 48 hours IV heparin, with no angina and especially with his ongoing fall risk, will DC anticoagulation  - Continue aspirin, statin (LDL 74)  Acute toxic encephalopathy: On reportedly chronic borderline cognitive status. Possible contribution now from anoxic brain injury. - Delirium precautions - Substance cessation  Cocaine use and intoxication: +UDS, chronic use.  - Cessation counseling provided - Avoid beta blockers, consider carvedilol if absolutely indicated.  Hypophosphatemia: Supplement and monitor  Acute hypoxic respiratory failure: Due to suspected aspiration pneumonia and pneumonitis in setting of cardiac arrest on severe emphysema baseline: Resolved hypoxia.  - Continue unasyn x5 days.   LFT elevation: Resolved.  AKI: Resolved  Severe protein calorie malnutrition:  - Supplement protein as able  Tobacco use:  - Cessation counseling  Subjective: Pt has no chest pain or dyspnea or other complaints. When RN stepped out  of room, pt pulled out his IV this AM. Later in the afternoon the patient jumped bed rails and fell when walking in room.   Objective: Vitals:   09/09/21 2027 09/10/21 0018 09/10/21 0428 09/10/21 1359  BP: 104/70 106/76 113/75 129/75  Pulse: 60 65 69 76  Resp: 16 18 20 20   Temp: 98.2 F (36.8 C) 98.2 F (36.8 C) 98.1 F (36.7 C) 98.4 F (36.9 C)  TempSrc: Oral Oral Oral Oral  SpO2: 96% 93% 90% 90%  Weight:   74 kg   Height:       Gen: Chronically ill-appearing 61 y.o. male in no distress Pulm: Nonlabored breathing room air. Clear CV: Regular rate and rhythm. No murmur, rub, or gallop. No JVD, no pitting dependent edema. GI: Abdomen soft, non-tender, non-distended, with normoactive bowel sounds.  Ext: Warm, no deformities Skin: No rashes, lesions or ulcers on visualized skin. Bleeding IV site which is hemostatic within a minute of pressure.  Neuro: Alert. Not cooperative with exam, but moves all extremities without focal neurological deficits. Psych: Judgement and insight appear impaired.    Data Personally reviewed:  CBC: Recent Labs  Lab 09/07/21 2325 09/07/21 2350 09/08/21 0558 09/09/21 0141 09/10/21 0139  WBC 11.2*  --  10.2 10.4 7.8  NEUTROABS 4.6  --   --   --   --   HGB 15.1 12.2* 14.2 12.6* 11.2*  HCT 44.5 36.0* 40.8 37.9* 34.1*  MCV 98.5  --  93.4 95.7 95.3  PLT 368  --  297 251 [id number]   Basic Metabolic Panel: Recent Labs  Lab 09/07/21 2325 09/07/21 2350 09/08/21 0558 09/09/21 0141 09/10/21 0139  NA 137 136 139 139 137  K 3.7  3.0* 3.8 4.7 3.5  CL 102  --  106 109 106  CO2 17*  --  22 26 28   GLUCOSE 222*  --  108* 94 93  BUN 13  --  11 10 7   CREATININE 1.50*  --  1.11 0.94 1.04  CALCIUM 8.7*  --  8.3* 8.2* 8.2*  MG 2.3  --  2.6* 2.3 1.9  PHOS 4.0  --  2.6 2.6 1.3*   GFR: Estimated Creatinine Clearance: 81 mL/min (by C-G formula based on SCr of 1.04 mg/dL). Liver Function Tests: Recent Labs  Lab 09/07/21 2325 09/08/21 0558 09/09/21 0141  09/10/21 0139  AST 88* 71* 44* 28  ALT 58* 54* 40 28  ALKPHOS 137* 125 98 81  BILITOT 0.6 0.5 0.5 0.4  PROT 5.9* 6.0* 5.6* 5.3*  ALBUMIN 3.2* 3.2* 2.9* 2.7*   Recent Labs  Lab 09/07/21 2325  LIPASE 35   Recent Labs  Lab 09/07/21 2325  AMMONIA 24   Coagulation Profile: Recent Labs  Lab 09/07/21 2325  INR 1.0   Cardiac Enzymes: No results for input(s): "CKTOTAL", "CKMB", "CKMBINDEX", "TROPONINI" in the last 168 hours. BNP (last 3 results) No results for input(s): "PROBNP" in the last 8760 hours. HbA1C: Recent Labs    09/08/21 0558  HGBA1C 5.3   CBG: Recent Labs  Lab 09/09/21 2019 09/10/21 0020 09/10/21 0425 09/10/21 0818 09/10/21 1147  GLUCAP 89 77 79 95 108*   Lipid Profile: Recent Labs    09/08/21 0558 09/10/21 0139  CHOL  --  128  HDL  --  30*  LDLCALC  --  74  TRIG 89 122  CHOLHDL  --  4.3   Thyroid Function Tests: Recent Labs    09/07/21 2325  TSH 3.788   Anemia Panel: No results for input(s): "VITAMINB12", "FOLATE", "FERRITIN", "TIBC", "IRON", "RETICCTPCT" in the last 72 hours. Urine analysis:    Component Value Date/Time   COLORURINE YELLOW 09/07/2021 2305   APPEARANCEUR HAZY (A) 09/07/2021 2305   LABSPEC 1.010 09/07/2021 2305   PHURINE 5.0 09/07/2021 2305   GLUCOSEU 50 (A) 09/07/2021 2305   HGBUR SMALL (A) 09/07/2021 2305   BILIRUBINUR NEGATIVE 09/07/2021 2305   KETONESUR NEGATIVE 09/07/2021 2305   PROTEINUR 100 (A) 09/07/2021 2305   NITRITE NEGATIVE 09/07/2021 2305   LEUKOCYTESUR NEGATIVE 09/07/2021 2305   Recent Results (from the past 240 hour(s))  Resp Panel by RT-PCR (Flu A&B, Covid) Anterior Nasal Swab     Status: None   Collection Time: 09/07/21 11:19 PM   Specimen: Anterior Nasal Swab  Result Value Ref Range Status   SARS Coronavirus 2 by RT PCR NEGATIVE NEGATIVE Final    Comment: (NOTE) SARS-CoV-2 target nucleic acids are NOT DETECTED.  The SARS-CoV-2 RNA is generally detectable in upper respiratory specimens  during the acute phase of infection. The lowest concentration of SARS-CoV-2 viral copies this assay can detect is 138 copies/mL. A negative result does not preclude SARS-Cov-2 infection and should not be used as the sole basis for treatment or other patient management decisions. A negative result may occur with  improper specimen collection/handling, submission of specimen other than nasopharyngeal swab, presence of viral mutation(s) within the areas targeted by this assay, and inadequate number of viral copies(<138 copies/mL). A negative result must be combined with clinical observations, patient history, and epidemiological information. The expected result is Negative.  Fact Sheet for Patients:  EntrepreneurPulse.com.au  Fact Sheet for Healthcare Providers:  IncredibleEmployment.be  This test is no t yet approved  or cleared by the [place] and  has been authorized for detection and/or diagnosis of SARS-CoV-2 by FDA under an Emergency Use Authorization (EUA). This EUA will remain  in effect (meaning this test can be used) for the duration of the COVID-19 declaration under Section 564(b)(1) of the Act, 21 U.S.C.section 360bbb-3(b)(1), unless the authorization is terminated  or revoked sooner.       Influenza A by PCR NEGATIVE NEGATIVE Final   Influenza B by PCR NEGATIVE NEGATIVE Final    Comment: (NOTE) The Xpert Xpress SARS-CoV-2/FLU/RSV plus assay is intended as an aid in the diagnosis of influenza from Nasopharyngeal swab specimens and should not be used as a sole basis for treatment. Nasal washings and aspirates are unacceptable for Xpert Xpress SARS-CoV-2/FLU/RSV testing.  Fact Sheet for Patients: BloggerCourse.com  Fact Sheet for Healthcare Providers: SeriousBroker.it  This test is not yet approved or cleared by the [place] FDA and has been authorized for detection  and/or diagnosis of SARS-CoV-2 by FDA under an Emergency Use Authorization (EUA). This EUA will remain in effect (meaning this test can be used) for the duration of the COVID-19 declaration under Section 564(b)(1) of the Act, 21 U.S.C. section 360bbb-3(b)(1), unless the authorization is terminated or revoked.  Performed at [facility], [address], [place], [place] [address]   Culture, blood (routine x 2)     Status: None (Preliminary result)   Collection Time: 09/07/21 11:25 PM   Specimen: BLOOD  Result Value Ref Range Status   Specimen Description BLOOD RIGHT ANTECUBITAL  Final   Special Requests   Final    BOTTLES DRAWN AEROBIC AND ANAEROBIC Blood Culture results may not be optimal due to an excessive volume of blood received in culture bottles   Culture   Final    NO GROWTH 2 DAYS Performed at [facility], [address] [place], [place] [address]    Report Status PENDING  Incomplete  Culture, blood (routine x 2)     Status: None (Preliminary result)   Collection Time: 09/07/21 11:25 PM   Specimen: BLOOD LEFT HAND  Result Value Ref Range Status   Specimen Description BLOOD LEFT HAND  Final   Special Requests AEROBIC BOTTLE ONLY Blood Culture adequate volume  Final   Culture   Final    NO GROWTH 2 DAYS Performed at [facility], [address] [place], [place] [address]    Report Status PENDING  Incomplete  MRSA Next Gen by PCR, Nasal     Status: None   Collection Time: 09/08/21  5:47 AM   Specimen: Nasal Mucosa; Nasal Swab  Result Value Ref Range Status   MRSA by PCR Next Gen NOT DETECTED NOT DETECTED Final    Comment: (NOTE) The GeneXpert MRSA Assay (FDA approved for NASAL specimens only), is one component of a comprehensive MRSA colonization surveillance program. It is not intended to diagnose MRSA infection nor to guide or monitor treatment for MRSA infections. Test performance is not FDA approved in patients less than 45  years old. Performed at [facility], [address], [place], [place] [address]      ECHOCARDIOGRAM LIMITED  Result Date: 09/10/2021    ECHOCARDIOGRAM LIMITED REPORT   Patient Name:   MAYS BUSSCHER Date of Exam: 09/10/2021 Medical Rec #:  [id number]        Height:       71.0 in Accession #:    [id number]  Weight:       163.1 lb Date of Birth:  [birth year]        BSA:          1.933 m Patient Age:    20 years         BP:           118/78 mmHg Patient Gender: M                HR:           78 bpm. Exam Location:  Inpatient Procedure: Limited Echo Indications:    Elevated troponin  History:        Patient has prior history of Echocardiogram examinations, most                 recent 09/08/2021. Stroke, Arrythmias:Cardiac Arrest; Risk                 Factors:Current Smoker. Cocaine induced cardiac arrest. 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  Sonographer:    Merrie Roof RDCS Referring Phys: [id number] [place]  1. Limited study to assess LV function; full images not obtained. Basal inferior hypokinesis with overall preserved LV function.  2. Left ventricular ejection fraction, by estimation, is 60 to 65%. The left ventricle has normal function. The left ventricle demonstrates regional wall motion abnormalities (see scoring diagram/findings for description).  3. Right ventricular systolic function is mildly reduced. The right ventricular size is mildly enlarged.  4. The mitral valve is normal in structure. Mild mitral valve regurgitation.  5. The inferior vena cava is dilated in size with >50% respiratory variability, suggesting right atrial pressure of 8 mmHg. FINDINGS  Left Ventricle: Left ventricular ejection fraction, by estimation, is 60 to 65%. The left ventricle has normal function. The left ventricle demonstrates regional wall motion abnormalities. The left ventricular internal cavity size was normal in size. Right Ventricle: The  right ventricular size is mildly enlarged. Right ventricular systolic function is mildly reduced. The tricuspid regurgitant velocity is 2.45 m/s, and with an assumed right atrial pressure of 8 mmHg, the estimated right ventricular systolic pressure is [id number]. Left Atrium: Left atrial size was normal in size. Right Atrium: Right atrial size was normal in size. Pericardium: There is no evidence of pericardial effusion. Mitral Valve: The mitral valve is normal in structure. Mild mitral valve regurgitation. Tricuspid Valve: The tricuspid valve is normal in structure. Tricuspid valve regurgitation is mild. Aorta: The aortic root is normal in size and structure. Venous: The inferior vena cava is dilated in size with greater than 50% respiratory variability, suggesting right atrial pressure of 8 mmHg. Additional Comments: Limited study to assess LV function; full images not obtained. Basal inferior hypokinesis with overall preserved LV function. RIGHT VENTRICLE          IVC RV Basal diam:  4.00 cm  IVC diam: 2.30 cm RV Mid diam:    2.70 cm TRICUSPID VALVE TR Peak grad:   24.0 mmHg TR Vmax:        245.00 cm/s Kirk Ruths MD Electronically signed by Kirk Ruths MD Signature Date/Time: 09/10/2021/12:36:49 PM    Final    DG Swallowing Func-Speech Pathology  Result Date: 09/09/2021 Table formatting from the original result was not included. Images from the original result were not included. Objective Swallowing Evaluation: Type of Study: MBS-Modified Barium  Swallow Study  Patient Details Name: TAKEEM KUCHER MRN: [id number] Date of Birth: June 15, 1960 Today's Date: 09/09/2021 Time: SLP Start Time (ACUTE ONLY): 1400 -SLP Stop Time (ACUTE ONLY): 1420 SLP Time Calculation (min) (ACUTE ONLY): 20 min Past Medical History: Past Medical History: Diagnosis Date  COVID-19 02/2021  Low back pain   Stroke [facility])  Past Surgical History: No past surgical history on file. HPI: Pt is a 61 year old male admitted with Cardiac arrest- VT,  VF- likley due to acute drug intoxication. Likely NSTEMI likely from demand. Acute encephalopathy, concern for anoxic vs toxic encephalopathy. Sister mentions baseline cognitive impairment and speech difficulties. Intubated for one day 6/7- 6/8.  No data recorded  Recommendations for follow up therapy are one component of a multi-disciplinary discharge planning process, led by the attending physician.  Recommendations may be updated based on patient status, additional functional criteria and insurance authorization. Assessment / Plan / Recommendation   09/09/2021   2:00 PM Clinical Impressions Clinical Impression Pt demonstrates restlessness and poor positioning by the end of MBS. Initially pt was able to sip from a straw upright with thin liquids with no impairment. Pt slid himself to a more horizontal position, and liquids arrived deeper into the pharynx prior to swallowing with sensed aspiraiton (PAS7). Coughing elicited regurgitation from cervical esophagus. Transitioned to nectar thick liquids with success. Pt tolerated solids well despite missing dentition. Recommend nectar thick liquids and mechanical soft solids. Will f/u for tolerance. SLP Visit Diagnosis Dysphagia, oropharyngeal phase (R13.12) Impact on safety and function Mild aspiration risk     09/09/2021   2:00 PM Treatment Recommendations Treatment Recommendations Therapy as outlined in treatment plan below      No data to display      09/09/2021   2:00 PM Diet Recommendations SLP Diet Recommendations Nectar thick liquid;Dysphagia 3 (Mech soft) solids Liquid Administration via Cup;Straw Medication Administration Whole meds with liquid Compensations Slow rate;Small sips/bites     09/09/2021   2:00 PM Other Recommendations Oral Care Recommendations Oral care BID    No data to display        09/09/2021   2:00 PM Oral Phase Oral Phase Impaired Oral - Nectar Straw WFL Oral - Thin Straw WFL Oral - Puree WFL Oral - Mech Soft [facility]    09/09/2021   2:00 PM Pharyngeal Phase  Pharyngeal Phase Impaired Pharyngeal- Nectar Straw Delayed swallow initiation-pyriform sinuses Pharyngeal- Thin Cup Delayed swallow initiation-pyriform sinuses Pharyngeal- Thin Straw Delayed swallow initiation-vallecula;Delayed swallow initiation-pyriform sinuses;Penetration/Aspiration before swallow Pharyngeal Material enters airway, passes BELOW cords and not ejected out despite cough attempt by patient;Material does not enter airway Pharyngeal- Puree WFL Pharyngeal- Regular WFL     No data to display    DeBlois, Katherene Ponto 09/09/2021, 2:38 PM                     CT Angio Chest Pulmonary Embolism (PE) W or WO Contrast  Result Date: 09/08/2021 CLINICAL DATA:  Concern for PE on echo. EXAM: CT ANGIOGRAPHY CHEST WITH CONTRAST TECHNIQUE: Multidetector CT imaging of the chest was performed using the standard protocol during bolus administration of intravenous contrast. Multiplanar CT image reconstructions and MIPs were obtained to evaluate the vascular anatomy. RADIATION DOSE REDUCTION: This exam was performed according to the departmental dose-optimization program which includes automated exposure control, adjustment of the mA and/or kV according to patient size and/or use of iterative reconstruction technique. CONTRAST:  151mL OMNIPAQUE IOHEXOL 350 MG/ML SOLN COMPARISON:  None Available.  FINDINGS: Cardiovascular: Satisfactory opacification of the pulmonary arteries to the segmental level. No evidence of pulmonary embolism. Normal heart size. No pericardial effusion. There are atherosclerotic calcifications of the coronary arteries. Mediastinum/Nodes: There is some mildly hyperdense fluid and stranding in the anterior mediastinum at the level of the heart. There are no enlarged mediastinal or hilar lymph nodes. The visualized thyroid gland is within normal limits. Small hiatal hernia is present. Lungs/Pleura: Severe emphysematous changes are present. There is diffuse peribronchial wall thickening, most  significant in the right lower lobe. Trachea and central airways are patent. There is some patchy airspace disease and ground-glass opacity in the bilateral lung bases/lower lobes. There are trace bilateral pleural effusions. Upper Abdomen: No acute abnormality. Musculoskeletal: No chest wall abnormality. No acute or significant osseous findings. Review of the MIP images confirms the above findings. IMPRESSION: 1. No evidence for pulmonary embolism. 2. Small amount of fluid and stranding in the anterior mediastinum of uncertain etiology. Please correlate clinically for infection or resolving anterior mediastinal hematoma. 3. Diffuse peribronchial wall thickening may be infectious/inflammatory or related to edema. 4. Minimal airspace and ground-glass opacities in the bilateral lower lobes worrisome for infection. 5. Trace bilateral pleural effusions. 6. Severe emphysema. Emphysema (ICD10-J43.9). Electronically Signed   By: Ronney Asters M.D.   On: 09/08/2021 21:34     Family Communication: None at bedside  Disposition: Status is: Inpatient Remains inpatient appropriate because: Continued monitoring, anticipate CIR disposition. Planned Discharge Destination:  CIR  Patrecia Pour, MD 09/10/2021 3:35 PM Page by [email or web address]

## 2021-09-11 LAB — BASIC METABOLIC PANEL
Anion gap: 7 (ref 5–15)
BUN: 8 mg/dL (ref 6–20)
CO2: 24 mmol/L (ref 22–32)
Calcium: 8.1 mg/dL — ABNORMAL LOW (ref 8.9–10.3)
Chloride: 107 mmol/L (ref 98–111)
Creatinine, Ser: 1.08 mg/dL (ref 0.61–1.24)
GFR, Estimated: >60 mL/min (ref >60)
Glucose, Bld: 104 mg/dL — ABNORMAL HIGH (ref 70–99)
Potassium: 3.8 mmol/L (ref 3.5–5.1)
Sodium: 138 mmol/L (ref 135–145)

## 2021-09-11 LAB — GLUCOSE, CAPILLARY
Glucose-Capillary: 103 mg/dL — ABNORMAL HIGH (ref 70–99)
Glucose-Capillary: 109 mg/dL — ABNORMAL HIGH (ref 70–99)
Glucose-Capillary: 129 mg/dL — ABNORMAL HIGH (ref 70–99)
Glucose-Capillary: 92 mg/dL (ref 70–99)
Glucose-Capillary: 96 mg/dL (ref 70–99)
Glucose-Capillary: 98 mg/dL (ref 70–99)

## 2021-09-11 LAB — PHOSPHORUS: Phosphorus: 4 mg/dL (ref 2.5–4.6)

## 2021-09-11 LAB — CBC
HCT: 35.5 % — ABNORMAL LOW (ref 39.0–52.0)
Hemoglobin: 11.9 g/dL — ABNORMAL LOW (ref 13.0–17.0)
MCH: 31.6 pg (ref 26.0–34.0)
MCHC: 33.5 g/dL (ref 30.0–36.0)
MCV: 94.2 fL (ref 80.0–100.0)
Platelets: 275 10*3/uL (ref 150–400)
RBC: 3.77 MIL/uL — ABNORMAL LOW (ref 4.22–5.81)
RDW: 12.6 % (ref 11.5–15.5)
WBC: 7 10*3/uL (ref 4.0–10.5)
nRBC: 0 % (ref 0.0–0.2)

## 2021-09-11 LAB — MAGNESIUM: Magnesium: 1.8 mg/dL (ref 1.7–2.4)

## 2021-09-11 NOTE — Progress Notes (Signed)
Progress Note  Patient: Scott Grimes [id number] DOB: 1960/05/31  DOA: 09/07/2021  DOS: 09/11/2021    Brief hospital course: Scott Grimes is a 61 y.o. male with a history of cocaine use, intellectual disability who presented by EMS to the ED on 6/7 after sustaining VT, then VF cardiac arrest after a witnessed seizure with ROSC after CPR and cardioversion. Lidocaine started by EMS and patient intubated in ED. Ultimately extubated and off pressors 6/8, continued on unasyn for aspiration pneumonia. Troponin was elevated, and heparin IV was given for 48 hours. Cardiology consulted and with improved LVEF, recommend no further inpatient investigations.   Assessment and Plan: VT OOH cardiac arrest: Due to cocaine use. No recurrence on amiodarone gtt, now in sinus bradycardia.  - Cardiology consulted. No further management recommended aside from cocaine cessation.  Acute systolic CHF: Improved by serial echocardiograms. No further work up or treatment recommended by cardiology.   Demand myocardial ischemia, type 2 NSTEMI:  - Repeat echo showed improvement w/LVEF ~60-65% with basal inferior hypokinesis. Cardiology advises against any further work up as an inpatient. Can follow up after discharge.  - Completed 48 hours IV heparin which was subsequently stopped due to fall risk (fell as inpatient 6/10)  - Continue aspirin, statin (LDL 74)  Acute toxic encephalopathy: On reportedly chronic borderline cognitive status. Possible contribution now from anoxic brain injury. - Delirium precautions - Substance cessation  Cocaine use and intoxication: +UDS, chronic use.  - Cessation counseling provided - Avoid beta blockers, consider carvedilol if absolutely indicated.  Hypophosphatemia: Resolved with supplementation  Hypoalbuminemia: Improve nutritional status.  Acute hypoxic respiratory failure: Due to suspected aspiration pneumonia and pneumonitis in setting of cardiac arrest on severe  emphysema baseline: Resolved hypoxia.  - Continue unasyn x5 days.   LFT elevation: Resolved.  AKI: Resolved  Severe protein calorie malnutrition:  - Supplement protein as able  Tobacco use:  - Cessation counseling  Subjective: Pt has no complaints this morning. No events overnight since falling in the afternoon yesterday.   Objective: Vitals:   09/10/21 2028 09/11/21 0652 09/11/21 0901 09/11/21 1129  BP: 118/66 114/64 (!) 143/97 107/78  Pulse: 71 61 67 60  Resp: 20 18 18 18   Temp: 98.4 F (36.9 C) 98.3 F (36.8 C)    TempSrc: Oral Oral    SpO2: 93% 93% 97% 91%  Weight:  72 kg    Height:      Gen: Chronically ill-appearing male in no distress Pulm: Nonlabored breathing room air. Clear. CV: Regular rate and rhythm. No murmur, rub, or gallop. No JVD, no dependent edema. GI: Abdomen soft, non-tender, non-distended, with normoactive bowel sounds.  Ext: Warm, no deformities Skin: No bleeding wounds or exudate.    Neuro: Awoke from sleep, rousable, not cooperative with full exam this morning but moves all extremities, follows commands.  Psych: Judgement and insight appear impaired.  Data Personally reviewed:  CBC: Recent Labs  Lab 09/07/21 2325 09/07/21 2350 09/08/21 0558 09/09/21 0141 09/10/21 0139 09/11/21 0039  WBC 11.2*  --  10.2 10.4 7.8 7.0  NEUTROABS 4.6  --   --   --   --   --   HGB 15.1 12.2* 14.2 12.6* 11.2* 11.9*  HCT 44.5 36.0* 40.8 37.9* 34.1* 35.5*  MCV 98.5  --  93.4 95.7 95.3 94.2  PLT 368  --  297 251 256 [id number]   Basic Metabolic Panel: Recent Labs  Lab 09/07/21 2325 09/07/21 2350 09/08/21 0558 09/09/21 0141 09/10/21 0139 09/11/21  0039  NA 137 136 139 139 137 138  K 3.7 3.0* 3.8 4.7 3.5 3.8  CL 102  --  106 109 106 107  CO2 17*  --  22 26 28 24   GLUCOSE 222*  --  108* 94 93 104*  BUN 13  --  11 10 7 8   CREATININE 1.50*  --  1.11 0.94 1.04 1.08  CALCIUM 8.7*  --  8.3* 8.2* 8.2* 8.1*  MG 2.3  --  2.6* 2.3 1.9 1.8  PHOS 4.0  --  2.6 2.6  1.3* 4.0   GFR: Estimated Creatinine Clearance: 75.9 mL/min (by C-G formula based on SCr of 1.08 mg/dL). Liver Function Tests: Recent Labs  Lab 09/07/21 2325 09/08/21 0558 09/09/21 0141 09/10/21 0139  AST 88* 71* 44* 28  ALT 58* 54* 40 28  ALKPHOS 137* 125 98 81  BILITOT 0.6 0.5 0.5 0.4  PROT 5.9* 6.0* 5.6* 5.3*  ALBUMIN 3.2* 3.2* 2.9* 2.7*   Recent Labs  Lab 09/07/21 2325  LIPASE 35   Recent Labs  Lab 09/07/21 2325  AMMONIA 24   Coagulation Profile: Recent Labs  Lab 09/07/21 2325  INR 1.0   Cardiac Enzymes: No results for input(s): "CKTOTAL", "CKMB", "CKMBINDEX", "TROPONINI" in the last 168 hours. BNP (last 3 results) No results for input(s): "PROBNP" in the last 8760 hours. HbA1C: No results for input(s): "HGBA1C" in the last 72 hours.  CBG: Recent Labs  Lab 09/10/21 2129 09/11/21 0007 09/11/21 0455 09/11/21 0818 09/11/21 1131  GLUCAP 104* 109* 96 92 98   Lipid Profile: Recent Labs    09/10/21 0139  CHOL 128  HDL 30*  LDLCALC 74  TRIG 122  CHOLHDL 4.3   Thyroid Function Tests: No results for input(s): "TSH", "T4TOTAL", "FREET4", "T3FREE", "THYROIDAB" in the last 72 hours.  Anemia Panel: No results for input(s): "VITAMINB12", "FOLATE", "FERRITIN", "TIBC", "IRON", "RETICCTPCT" in the last 72 hours. Urine analysis:    Component Value Date/Time   COLORURINE YELLOW 09/07/2021 2305   APPEARANCEUR HAZY (A) 09/07/2021 2305   LABSPEC 1.010 09/07/2021 2305   PHURINE 5.0 09/07/2021 2305   GLUCOSEU 50 (A) 09/07/2021 2305   HGBUR SMALL (A) 09/07/2021 2305   BILIRUBINUR NEGATIVE 09/07/2021 2305   KETONESUR NEGATIVE 09/07/2021 2305   PROTEINUR 100 (A) 09/07/2021 2305   NITRITE NEGATIVE 09/07/2021 2305   LEUKOCYTESUR NEGATIVE 09/07/2021 2305   Recent Results (from the past 240 hour(s))  Resp Panel by RT-PCR (Flu A&B, Covid) Anterior Nasal Swab     Status: None   Collection Time: 09/07/21 11:19 PM   Specimen: Anterior Nasal Swab  Result Value Ref  Range Status   SARS Coronavirus 2 by RT PCR NEGATIVE NEGATIVE Final    Comment: (NOTE) SARS-CoV-2 target nucleic acids are NOT DETECTED.  The SARS-CoV-2 RNA is generally detectable in upper respiratory specimens during the acute phase of infection. The lowest concentration of SARS-CoV-2 viral copies this assay can detect is 138 copies/mL. A negative result does not preclude SARS-Cov-2 infection and should not be used as the sole basis for treatment or other patient management decisions. A negative result may occur with  improper specimen collection/handling, submission of specimen other than nasopharyngeal swab, presence of viral mutation(s) within the areas targeted by this assay, and inadequate number of viral copies(<138 copies/mL). A negative result must be combined with clinical observations, patient history, and epidemiological information. The expected result is Negative.  Fact Sheet for Patients:  EntrepreneurPulse.com.au  Fact Sheet for Healthcare Providers:  IncredibleEmployment.be  This test is no t yet approved or cleared by the [place] and  has been authorized for detection and/or diagnosis of SARS-CoV-2 by FDA under an Emergency Use Authorization (EUA). This EUA will remain  in effect (meaning this test can be used) for the duration of the COVID-19 declaration under Section 564(b)(1) of the Act, 21 U.S.C.section 360bbb-3(b)(1), unless the authorization is terminated  or revoked sooner.       Influenza A by PCR NEGATIVE NEGATIVE Final   Influenza B by PCR NEGATIVE NEGATIVE Final    Comment: (NOTE) The Xpert Xpress SARS-CoV-2/FLU/RSV plus assay is intended as an aid in the diagnosis of influenza from Nasopharyngeal swab specimens and should not be used as a sole basis for treatment. Nasal washings and aspirates are unacceptable for Xpert Xpress SARS-CoV-2/FLU/RSV testing.  Fact Sheet for  Patients: EntrepreneurPulse.com.au  Fact Sheet for Healthcare Providers: IncredibleEmployment.be  This test is not yet approved or cleared by the [place] FDA and has been authorized for detection and/or diagnosis of SARS-CoV-2 by FDA under an Emergency Use Authorization (EUA). This EUA will remain in effect (meaning this test can be used) for the duration of the COVID-19 declaration under Section 564(b)(1) of the Act, 21 U.S.C. section 360bbb-3(b)(1), unless the authorization is terminated or revoked.  Performed at [facility], [place] [address], [place], [place] [address]   Culture, blood (routine x 2)     Status: None (Preliminary result)   Collection Time: 09/07/21 11:25 PM   Specimen: BLOOD  Result Value Ref Range Status   Specimen Description BLOOD RIGHT ANTECUBITAL  Final   Special Requests   Final    BOTTLES DRAWN AEROBIC AND ANAEROBIC Blood Culture results may not be optimal due to an excessive volume of blood received in culture bottles   Culture   Final    NO GROWTH 2 DAYS Performed at [facility], [place] [address], [place], [place] [address]    Report Status PENDING  Incomplete  Culture, blood (routine x 2)     Status: None (Preliminary result)   Collection Time: 09/07/21 11:25 PM   Specimen: BLOOD LEFT HAND  Result Value Ref Range Status   Specimen Description BLOOD LEFT HAND  Final   Special Requests AEROBIC BOTTLE ONLY Blood Culture adequate volume  Final   Culture   Final    NO GROWTH 2 DAYS Performed at [facility], [place] [address] [place], [place] [address]    Report Status PENDING  Incomplete  MRSA Next Gen by PCR, Nasal     Status: None   Collection Time: 09/08/21  5:47 AM   Specimen: Nasal Mucosa; Nasal Swab  Result Value Ref Range Status   MRSA by PCR Next Gen NOT DETECTED NOT DETECTED Final    Comment: (NOTE) The GeneXpert MRSA Assay (FDA approved for NASAL specimens only), is one component  of a comprehensive MRSA colonization surveillance program. It is not intended to diagnose MRSA infection nor to guide or monitor treatment for MRSA infections. Test performance is not FDA approved in patients less than 19 years old. Performed at [facility], [place] [address] [place], [place] [address]      ECHOCARDIOGRAM LIMITED  Result Date: 09/10/2021    ECHOCARDIOGRAM LIMITED REPORT   Patient Name:   CHELSEY ALIX Date of Exam: 09/10/2021 Medical Rec #:  [id number]        Height:       71.0 in Accession #:  [id number]       Weight:       163.1 lb Date of Birth:  January 25, 1961        BSA:          1.933 m Patient Age:    54 years         BP:           118/78 mmHg Patient Gender: M                HR:           78 bpm. Exam Location:  Inpatient Procedure: Limited Echo Indications:    Elevated troponin  History:        Patient has prior history of Echocardiogram examinations, most                 recent 09/08/2021. Stroke, Arrythmias:Cardiac Arrest; Risk                 Factors:Current Smoker. Cocaine induced cardiac arrest. 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  Sonographer:    Merrie Roof RDCS Referring Phys: [id number] [place]  1. Limited study to assess LV function; full images not obtained. Basal inferior hypokinesis with overall preserved LV function.  2. Left ventricular ejection fraction, by estimation, is 60 to 65%. The left ventricle has normal function. The left ventricle demonstrates regional wall motion abnormalities (see scoring diagram/findings for description).  3. Right ventricular systolic function is mildly reduced. The right ventricular size is mildly enlarged.  4. The mitral valve is normal in structure. Mild mitral valve regurgitation.  5. The inferior vena cava is dilated in size with >50% respiratory variability, suggesting right atrial pressure of 8 mmHg. FINDINGS  Left Ventricle: Left ventricular ejection fraction,  by estimation, is 60 to 65%. The left ventricle has normal function. The left ventricle demonstrates regional wall motion abnormalities. The left ventricular internal cavity size was normal in size. Right Ventricle: The right ventricular size is mildly enlarged. Right ventricular systolic function is mildly reduced. The tricuspid regurgitant velocity is 2.45 m/s, and with an assumed right atrial pressure of 8 mmHg, the estimated right ventricular systolic pressure is [id number]. Left Atrium: Left atrial size was normal in size. Right Atrium: Right atrial size was normal in size. Pericardium: There is no evidence of pericardial effusion. Mitral Valve: The mitral valve is normal in structure. Mild mitral valve regurgitation. Tricuspid Valve: The tricuspid valve is normal in structure. Tricuspid valve regurgitation is mild. Aorta: The aortic root is normal in size and structure. Venous: The inferior vena cava is dilated in size with greater than 50% respiratory variability, suggesting right atrial pressure of 8 mmHg. Additional Comments: Limited study to assess LV function; full images not obtained. Basal inferior hypokinesis with overall preserved LV function. RIGHT VENTRICLE          IVC RV Basal diam:  4.00 cm  IVC diam: 2.30 cm RV Mid diam:    2.70 cm TRICUSPID VALVE TR Peak grad:   24.0 mmHg TR Vmax:        245.00 cm/s Kirk Ruths MD Electronically signed by Kirk Ruths MD Signature Date/Time: 09/10/2021/12:36:49 PM    Final    DG Swallowing Func-Speech Pathology  Result Date: 09/09/2021 Table formatting from the original result was not included. Images from the original result were not included. Objective  Swallowing Evaluation: Type of Study: MBS-Modified Barium Swallow Study  Patient Details Name: GEOVANNY PEDREGON MRN: [id number] Date of Birth: January 05, 1961 Today's Date: 09/09/2021 Time: SLP Start Time (ACUTE ONLY): 1400 -SLP Stop Time (ACUTE ONLY): 1420 SLP Time Calculation (min) (ACUTE ONLY): 20 min Past  Medical History: Past Medical History: Diagnosis Date  COVID-19 02/2021  Low back pain   Stroke [facility])  Past Surgical History: No past surgical history on file. HPI: Pt is a 61 year old male admitted with Cardiac arrest- VT, VF- likley due to acute drug intoxication. Likely NSTEMI likely from demand. Acute encephalopathy, concern for anoxic vs toxic encephalopathy. Sister mentions baseline cognitive impairment and speech difficulties. Intubated for one day 6/7- 6/8.  No data recorded  Recommendations for follow up therapy are one component of a multi-disciplinary discharge planning process, led by the attending physician.  Recommendations may be updated based on patient status, additional functional criteria and insurance authorization. Assessment / Plan / Recommendation   09/09/2021   2:00 PM Clinical Impressions Clinical Impression Pt demonstrates restlessness and poor positioning by the end of MBS. Initially pt was able to sip from a straw upright with thin liquids with no impairment. Pt slid himself to a more horizontal position, and liquids arrived deeper into the pharynx prior to swallowing with sensed aspiraiton (PAS7). Coughing elicited regurgitation from cervical esophagus. Transitioned to nectar thick liquids with success. Pt tolerated solids well despite missing dentition. Recommend nectar thick liquids and mechanical soft solids. Will f/u for tolerance. SLP Visit Diagnosis Dysphagia, oropharyngeal phase (R13.12) Impact on safety and function Mild aspiration risk     09/09/2021   2:00 PM Treatment Recommendations Treatment Recommendations Therapy as outlined in treatment plan below      No data to display      09/09/2021   2:00 PM Diet Recommendations SLP Diet Recommendations Nectar thick liquid;Dysphagia 3 (Mech soft) solids Liquid Administration via Cup;Straw Medication Administration Whole meds with liquid Compensations Slow rate;Small sips/bites     09/09/2021   2:00 PM Other Recommendations Oral Care  Recommendations Oral care BID    No data to display        09/09/2021   2:00 PM Oral Phase Oral Phase Impaired Oral - Nectar Straw WFL Oral - Thin Straw WFL Oral - Puree WFL Oral - Mech Soft [facility]    09/09/2021   2:00 PM Pharyngeal Phase Pharyngeal Phase Impaired Pharyngeal- Nectar Straw Delayed swallow initiation-pyriform sinuses Pharyngeal- Thin Cup Delayed swallow initiation-pyriform sinuses Pharyngeal- Thin Straw Delayed swallow initiation-vallecula;Delayed swallow initiation-pyriform sinuses;Penetration/Aspiration before swallow Pharyngeal Material enters airway, passes BELOW cords and not ejected out despite cough attempt by patient;Material does not enter airway Pharyngeal- Puree WFL Pharyngeal- Regular WFL     No data to display    DeBlois, Katherene Ponto 09/09/2021, 2:38 PM                       Family Communication: None at bedside  Disposition: Status is: Inpatient Remains inpatient appropriate because: Awaiting updated PT/OT recommendations Planned Discharge Destination:  CIR  Patrecia Pour, MD 09/11/2021 1:03 PM Page by [email or web address]

## 2021-09-11 NOTE — Evaluation (Signed)
Occupational Therapy Evaluation Patient Details Name: Scott Grimes MRN: [id number] DOB: 1960/11/29 Today's Date: 09/11/2021   History of Present Illness Pt is a 61 y.o. male admitted 09/07/21 with witnessed seizure, remained unresponsive, found to have VT then VF by EMS requiring cardioversion. Pt with acute encephalopathy, concern for anoxic vs toxic encephalopathy; UDS (+) cocaine. ETT 6/7-6/8. EEG 6/8 suggestive of moderate to severe diffuse encephalopathy, nonspecific etiology; no seizures noted. PMH includes chronic back pain, polysubstance abuse, stroke, intellectual disability.   Clinical Impression   Pt admitted for concerns listed above. PTA Pt reported that he was relatively independent, using a RW as needed. At this time, pt presents with cognitive deficits, balance deficits, weakness, and decreased activity tolerance. Pt is impulsive with decreased safety awareness, as well as not oriented to place, situation, or time. He is requiring up to mod A with all ADL's and functional mobility. Anticipate as cognition improves, pt will improve independence. Recommending AIR to maximize pts independence and safety. OT will follow acutely.      Recommendations for follow up therapy are one component of a multi-disciplinary discharge planning process, led by the attending physician.  Recommendations may be updated based on patient status, additional functional criteria and insurance authorization.   Follow Up Recommendations  Acute inpatient rehab (3hours/day)    Assistance Recommended at Discharge Frequent or constant Supervision/Assistance  Patient can return home with the following A lot of help with walking and/or transfers;A lot of help with bathing/dressing/bathroom;Assistance with cooking/housework;Direct supervision/assist for medications management;Direct supervision/assist for financial management;Assist for transportation;Help with stairs or ramp for entrance    Functional Status  Assessment  Patient has had a recent decline in their functional status and demonstrates the ability to make significant improvements in function in a reasonable and predictable amount of time.  Equipment Recommendations  Tub/shower bench;BSC/3in1    Recommendations for Other Services Rehab consult     Precautions / Restrictions Precautions Precautions: Fall;Other (comment) Precaution Comments: Watch SpO2 (pt reports he does not wear baseline) Restrictions Weight Bearing Restrictions: No      Mobility Bed Mobility Overal bed mobility: Needs Assistance Bed Mobility: Supine to Sit, Sit to Supine     Supine to sit: Mod assist, HOB elevated Sit to supine: Mod assist   General bed mobility comments: Pt requires inceased time and multimodal cues as well as assist with trunkal control and BLE managment.    Transfers                          Balance Overall balance assessment: Needs assistance Sitting-balance support: No upper extremity supported Sitting balance-Leahy Scale: Fair                                     ADL either performed or assessed with clinical judgement   ADL Overall ADL's : Needs assistance/impaired Eating/Feeding: Set up;Sitting   Grooming: Set up;Sitting   Upper Body Bathing: Set up;Sitting   Lower Body Bathing: Moderate assistance;Sit to/from stand;Sitting/lateral leans   Upper Body Dressing : Set up;Sitting   Lower Body Dressing: Moderate assistance;Sitting/lateral leans;Sit to/from stand                 General ADL Comments: Remained bed level due to cognition this session     Vision Baseline Vision/History: 0 No visual deficits Ability to See in Adequate Light: 0 Adequate Patient Visual Report:  No change from baseline Vision Assessment?: No apparent visual deficits     Perception     Praxis      Pertinent Vitals/Pain Pain Assessment Pain Assessment: No/denies pain     Hand Dominance Right    Extremity/Trunk Assessment Upper Extremity Assessment Upper Extremity Assessment: Overall WFL for tasks assessed   Lower Extremity Assessment Lower Extremity Assessment: Defer to PT evaluation   Cervical / Trunk Assessment Cervical / Trunk Assessment: Normal   Communication Communication Communication: Expressive difficulties   Cognition Arousal/Alertness: Awake/alert Behavior During Therapy: Restless Overall Cognitive Status: No family/caregiver present to determine baseline cognitive functioning Area of Impairment: Orientation, Attention, Following commands, Safety/judgement, Awareness, Problem solving                 Orientation Level: Disoriented to, Place, Time, Situation Current Attention Level: Focused   Following Commands: Follows one step commands with increased time, Follows multi-step commands inconsistently Safety/Judgement: Decreased awareness of safety, Decreased awareness of deficits Awareness: Intellectual Problem Solving: Slow processing, Decreased initiation, Difficulty sequencing, Requires verbal cues, Requires tactile cues General Comments: pt answering some questions appropriately, suspect unreliable historian; slow to respond and follow commands; speech difficult to understand at times     General Comments  VSS on RA, pt tearful throughout    Exercises     Shoulder Instructions      Home Living Family/patient expects to be discharged to:: Private residence Living Arrangements: Non-relatives/Friends Available Help at Discharge: Friend(s);Available PRN/intermittently Type of Home: House Home Access: Stairs to enter [employer] of Steps: 4 stairs Entrance Stairs-Rails: None Home Layout: One level     Bathroom Shower/Tub: Teacher, early years/pre: Standard     Home Equipment: Conservation officer, nature (2 wheels)   Additional Comments: Pt told OT he lives with "friends"      Prior Functioning/Environment Prior Level of  Function : Patient poor historian/Family not available             Mobility Comments: pt reports ambulatory with RW, does not use w/c ADLs Comments: reports independence        OT Problem List: Decreased strength;Decreased activity tolerance;Impaired balance (sitting and/or standing);Decreased coordination;Decreased cognition;Decreased safety awareness;Cardiopulmonary status limiting activity      OT Treatment/Interventions: Self-care/ADL training;Therapeutic exercise;Energy conservation;DME and/or AE instruction;Therapeutic activities;Cognitive remediation/compensation;Patient/family education;Balance training    OT Goals(Current goals can be found in the care plan section) Acute Rehab OT Goals Patient Stated Goal: To go home OT Goal Formulation: With patient Time For Goal Achievement: 09/25/21 Potential to Achieve Goals: Good ADL Goals Pt Will Perform Lower Body Bathing: with modified independence;sit to/from stand;sitting/lateral leans Pt Will Perform Lower Body Dressing: with modified independence;sitting/lateral leans;sit to/from stand Pt Will Transfer to Toilet: with modified independence;ambulating Pt Will Perform Toileting - Clothing Manipulation and hygiene: with modified independence;sitting/lateral leans;sit to/from stand Additional ADL Goal #1: Pt will complete a medicaiton managment with no errors.  OT Frequency: Min 2X/week    Co-evaluation              AM-PAC OT "6 Clicks" Daily Activity     Outcome Measure Help from another person eating meals?: A Little Help from another person taking care of personal grooming?: A Little Help from another person toileting, which includes using toliet, bedpan, or urinal?: A Lot Help from another person bathing (including washing, rinsing, drying)?: A Lot Help from another person to put on and taking off regular upper body clothing?: A Little Help from another person to put on and  taking off regular lower body clothing?: A  Lot 6 Click Score: 15   End of Session Nurse Communication: Mobility status  Activity Tolerance: Patient tolerated treatment well Patient left: in bed;with call bell/phone within reach;with bed alarm set;with restraints reapplied  OT Visit Diagnosis: Unsteadiness on feet (R26.81);Other abnormalities of gait and mobility (R26.89);Muscle weakness (generalized) (M62.81)                Time: [id number] OT Time Calculation (min): 29 min Charges:  OT General Charges $OT Visit: 1 Visit OT Evaluation $OT Eval Moderate Complexity: 1 Mod OT Treatments $Therapeutic Activity: 8-22 mins  Asyah Candler H., OTR/L Acute Rehabilitation  Kentravious Lipford Elane Nakeysha Pasqual 09/11/2021, 6:15 PM

## 2021-09-12 ENCOUNTER — Other Ambulatory Visit: Payer: Self-pay

## 2021-09-12 LAB — GLUCOSE, CAPILLARY
Glucose-Capillary: 107 mg/dL — ABNORMAL HIGH (ref 70–99)
Glucose-Capillary: 113 mg/dL — ABNORMAL HIGH (ref 70–99)
Glucose-Capillary: 118 mg/dL — ABNORMAL HIGH (ref 70–99)
Glucose-Capillary: 118 mg/dL — ABNORMAL HIGH (ref 70–99)
Glucose-Capillary: 85 mg/dL (ref 70–99)
Glucose-Capillary: 91 mg/dL (ref 70–99)

## 2021-09-12 LAB — BASIC METABOLIC PANEL
Anion gap: 7 (ref 5–15)
BUN: 9 mg/dL (ref 6–20)
CO2: 22 mmol/L (ref 22–32)
Calcium: 8.6 mg/dL — ABNORMAL LOW (ref 8.9–10.3)
Chloride: 111 mmol/L (ref 98–111)
Creatinine, Ser: 1.19 mg/dL (ref 0.61–1.24)
GFR, Estimated: >60 mL/min (ref >60)
Glucose, Bld: 92 mg/dL (ref 70–99)
Potassium: 3.8 mmol/L (ref 3.5–5.1)
Sodium: 140 mmol/L (ref 135–145)

## 2021-09-12 LAB — MAGNESIUM: Magnesium: 2 mg/dL (ref 1.7–2.4)

## 2021-09-12 LAB — CBC
HCT: 37.5 % — ABNORMAL LOW (ref 39.0–52.0)
Hemoglobin: 12.8 g/dL — ABNORMAL LOW (ref 13.0–17.0)
MCH: 31.8 pg (ref 26.0–34.0)
MCHC: 34.1 g/dL (ref 30.0–36.0)
MCV: 93.3 fL (ref 80.0–100.0)
Platelets: 288 10*3/uL (ref 150–400)
RBC: 4.02 MIL/uL — ABNORMAL LOW (ref 4.22–5.81)
RDW: 12.6 % (ref 11.5–15.5)
WBC: 6.8 10*3/uL (ref 4.0–10.5)
nRBC: 0 % (ref 0.0–0.2)

## 2021-09-12 LAB — PHOSPHORUS: Phosphorus: 3.5 mg/dL (ref 2.5–4.6)

## 2021-09-12 NOTE — Progress Notes (Signed)
Physical Therapy Treatment Patient Details Name: Scott Grimes MRN: [id number] DOB: 1960-07-12 Today's Date: 09/12/2021   History of Present Illness 61 y.o. male admitted 09/07/21 with witnessed seizure, remained unresponsive, found to have VT then VF by EMS requiring cardioversion. Pt with acute encephalopathy, concern for anoxic vs toxic encephalopathy; UDS (+) cocaine. ETT 6/7-6/8. EEG 6/8 suggestive of moderate to severe diffuse encephalopathy, nonspecific etiology; no seizures noted. PMHx: chronic back pain, polysubstance abuse, stroke, intellectual disability.    PT Comments    Pt pleasant, confused and able to demonstrate increased mobility and balance. Pt continues to struggle with awareness of deficits, impaired balance and gait. Pt unsure of what assist he can have when he leaves stating he lives with friends who do drugs and he can't do that again. Pt educated for situation, deficits and safety who will benefit from AIR to maximize balance, cognition and independence. Encouraged OOB and mobility with nursing staff.  SpO2 90-98% on RA with activity HR 54-62   Recommendations for follow up therapy are one component of a multi-disciplinary discharge planning process, led by the attending physician.  Recommendations may be updated based on patient status, additional functional criteria and insurance authorization.  Follow Up Recommendations  Acute inpatient rehab (3hours/day)     Assistance Recommended at Discharge Frequent or constant Supervision/Assistance  Patient can return home with the following A little help with walking and/or transfers;A little help with bathing/dressing/bathroom;Assistance with cooking/housework;Direct supervision/assist for financial management;Assist for transportation;Direct supervision/assist for medications management   Equipment Recommendations  Rolling walker (2 wheels);BSC/3in1    Recommendations for Other Services       Precautions /  Restrictions Precautions Precautions: Fall     Mobility  Bed Mobility Overal bed mobility: Needs Assistance Bed Mobility: Supine to Sit     Supine to sit: Min guard     General bed mobility comments: HOB 20 degrees with cues for sequence and HOB 20 degrees with use of rail    Transfers Overall transfer level: Needs assistance   Transfers: Sit to/from Stand Sit to Stand: Min assist           General transfer comment: pt able to stand from bed and recliner with min assist for balance. Pt performed 5 repeated sit to stands in 50 sec with guarding for safety, cues for hand placement and to control descent    Ambulation/Gait Ambulation/Gait assistance: Min assist Gait Distance (Feet): 150 Feet Assistive device: Rolling walker (2 wheels) Gait Pattern/deviations: Step-through pattern, Decreased stride length, Trunk flexed   Gait velocity interpretation: 1.31 - 2.62 ft/sec, indicative of limited community ambulator   General Gait Details: attempting single HHA to initiate gait with pt reaching for additional support and with LOB requiring physical assist to correct. with bil UE support on RW pt with min assist for gait with cues for RW use, posture, direction and safety. 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 Static  standing 2 min with minguard                            Cognition  Arousal/Alertness: Awake/alert Behavior During Therapy: WFL for tasks assessed/performed Overall Cognitive Status: No family/caregiver present to determine baseline cognitive functioning Area of Impairment: Orientation, Attention, Following commands, Safety/judgement, Awareness, Problem solving                 Orientation Level: Disoriented to, Place, Situation Current Attention Level: Sustained   Following Commands: Follows one step commands consistently Safety/Judgement: Decreased awareness of safety, Decreased awareness of deficits   Problem Solving: Slow processing, Decreased initiation, Difficulty sequencing, Requires verbal cues, Requires tactile cues General Comments: pt with decreased processing and awareness        Exercises General Exercises - Lower Extremity Long Arc Quad: AROM, Both, Seated, 10 reps    General Comments        Pertinent Vitals/Pain Pain Assessment Pain Assessment: No/denies pain    Home Living                          Prior Function            PT Goals (current goals can now be found in the care plan section) Progress towards PT goals: Progressing toward goals    Frequency    Min 3X/week      PT Plan Current plan remains appropriate;Equipment recommendations need to be updated    Co-evaluation              AM-PAC PT "6 Clicks" Mobility   Outcome Measure  Help needed turning from your back to your side while in a flat bed without using bedrails?: A Little Help needed moving from lying on your back to sitting on the side of a flat bed without using bedrails?: A Little Help needed moving to and from a bed to a chair (including a wheelchair)?: A Little Help needed standing up from a chair using your arms (e.g., wheelchair or bedside chair)?: A Little Help needed to walk in hospital room?: A Little Help needed climbing 3-5 steps with a railing? : A Lot 6 Click Score: 17    End of Session Equipment Utilized  During Treatment: Gait belt Activity Tolerance: Patient tolerated treatment well;Other (comment) Patient left: in chair;with call bell/phone within reach;with chair alarm set Nurse Communication: Mobility status PT Visit Diagnosis: Other abnormalities of gait and mobility (R26.89);Muscle weakness (generalized) (M62.81)     Time: [id number] PT Time Calculation (min) (ACUTE ONLY): 31 min  Charges:  $Gait Training: 8-22 mins $Therapeutic Activity: 8-22 mins                     Bayard Males, PT Acute Rehabilitation Services Office: [place] 09/12/2021, 10:22 AM

## 2021-09-12 NOTE — Progress Notes (Signed)
Inpatient Rehab Admissions Coordinator:   At this time, we are recommending a CIR consult and I will place an order for full assessment per our protocol.  Shann Medal, PT, DPT Admissions Coordinator [phone] 09/12/21  4:26 PM

## 2021-09-12 NOTE — Progress Notes (Signed)
Progress Note  Patient: Scott Grimes DOB: 10-05-1960  DOA: 09/07/2021  DOS: 09/12/2021    Brief hospital course: Scott Grimes is a 61 y.o. male with a history of cocaine use, intellectual disability who presented by EMS to the ED on 6/7 after sustaining VT, then VF cardiac arrest after a witnessed seizure with ROSC after CPR and cardioversion. Lidocaine started by EMS and patient intubated in ED. Ultimately extubated and off pressors 6/8, continued on unasyn for aspiration pneumonia. Troponin was elevated, and heparin IV was given for 48 hours. Cardiology consulted and with improved LVEF, recommend no further inpatient investigations.   Assessment and Plan:  Ventricular tachycardia with out of hospital cardiac arrest: Due to cocaine use. No recurrence on amiodarone gtt, now in sinus bradycardia.  - Cardiology consulted. No further management recommended aside from cocaine cessation -signing off, appreciate their assistance in this case.  Acute systolic CHF: Improved by serial echocardiograms. No further work up or treatment recommended by cardiology.   Demand myocardial ischemia, type 2 NSTEMI:  - Repeat echo showed improvement w/LVEF ~60-65% with basal inferior hypokinesis. Cardiology advises against any further work up as an inpatient. Can follow up after discharge.  - Completed 48 hours IV heparin which was subsequently stopped due to fall risk (fell as inpatient 6/10)  - Continue aspirin, statin (LDL 74)  Acute toxic encephalopathy: On reportedly chronic borderline cognitive status. Possible contribution now from anoxic brain injury. - Delirium precautions - Substance cessation  Cocaine use and intoxication: +UDS, chronic use.  - Cessation counseling provided - Avoid beta blockers, consider carvedilol if absolutely indicated.  Profound ambulatory dysfunction, multifactorial in the setting of above  -Unclear baseline ambulation at home but patient states he uses a  walker or cane only as needed and not consistently.  He has been unable to tolerate PT here in any meaningful way until today he required essentially max assist with an ataxic gait and was notably unsteady.  Unclear if patient will be safe to discharge home at this time given ongoing ambulatory dysfunction. -Patient does not currently qualify for CIR, if he continues to be unstable would recommend SNF per PT discussion however if he continues to improve drastically over the next 24 to 48 hours he may be safe for discharge home with home health.  Hypophosphatemia: Resolved with supplementation  Hypoalbuminemia: Improve nutritional status.  Acute hypoxic respiratory failure: Due to suspected aspiration pneumonia and pneumonitis in setting of cardiac arrest on severe emphysema baseline: Resolved hypoxia.  - Continue unasyn x5 days.   LFT elevation: Resolved.  AKI: Resolved  Severe protein calorie malnutrition:  - Supplement protein as able  Tobacco use:  - Cessation counseling  Subjective: No acute issues or events overnight denies nausea vomiting diarrhea constipation headache fevers chills or chest pain  Objective: Vitals:   09/11/21 0901 09/11/21 1129 09/11/21 2033 09/12/21 0433  BP: (!) 143/97 107/78 123/80 102/75  Pulse: 67 60 60 (!) 58  Resp: 18 18 18 16   Temp:  98.4 F (36.9 C) 98.3 F (36.8 C) 98.3 F (36.8 C)  TempSrc:  Oral Oral Oral  SpO2: 97% 91% 95% 95%  Weight:    71 kg  Height:      Gen: Chronically ill-appearing male in no distress Pulm: Nonlabored breathing room air. Clear. CV: Regular rate and rhythm. No murmur, rub, or gallop. No JVD, no dependent edema. GI: Abdomen soft, non-tender, non-distended, with normoactive bowel sounds.  Ext: Warm, no deformities Skin: No bleeding  wounds or exudate.    Neuro: Awoke from sleep, rousable, not cooperative with full exam this morning but moves all extremities, follows commands.  Psych: Judgement and insight appear  impaired.  Data Personally reviewed:  CBC: Recent Labs  Lab 09/07/21 2325 09/07/21 2350 09/08/21 0558 09/09/21 0141 09/10/21 0139 09/11/21 0039 09/12/21 0147  WBC 11.2*  --  10.2 10.4 7.8 7.0 6.8  NEUTROABS 4.6  --   --   --   --   --   --   HGB 15.1   < > 14.2 12.6* 11.2* 11.9* 12.8*  HCT 44.5   < > 40.8 37.9* 34.1* 35.5* 37.5*  MCV 98.5  --  93.4 95.7 95.3 94.2 93.3  PLT 368  --  297 251 256 275 288   < > = values in this interval not displayed.    Basic Metabolic Panel: Recent Labs  Lab 09/08/21 0558 09/09/21 0141 09/10/21 0139 09/11/21 0039 09/12/21 0147  NA 139 139 137 138 140  K 3.8 4.7 3.5 3.8 3.8  CL 106 109 106 107 111  CO2 22 26 28 24 22   GLUCOSE 108* 94 93 104* 92  BUN 11 10 7 8 9   CREATININE 1.11 0.94 1.04 1.08 1.19  CALCIUM 8.3* 8.2* 8.2* 8.1* 8.6*  MG 2.6* 2.3 1.9 1.8 2.0  PHOS 2.6 2.6 1.3* 4.0 3.5    GFR: Estimated Creatinine Clearance: 68 mL/min (by C-G formula based on SCr of 1.19 mg/dL). Liver Function Tests: Recent Labs  Lab 09/07/21 2325 09/08/21 0558 09/09/21 0141 09/10/21 0139  AST 88* 71* 44* 28  ALT 58* 54* 40 28  ALKPHOS 137* 125 98 81  BILITOT 0.6 0.5 0.5 0.4  PROT 5.9* 6.0* 5.6* 5.3*  ALBUMIN 3.2* 3.2* 2.9* 2.7*    Recent Labs  Lab 09/07/21 2325  LIPASE 35    Recent Labs  Lab 09/07/21 2325  AMMONIA 24    Coagulation Profile: Recent Labs  Lab 09/07/21 2325  INR 1.0    Cardiac Enzymes: No results for input(s): "CKTOTAL", "CKMB", "CKMBINDEX", "TROPONINI" in the last 168 hours. BNP (last 3 results) No results for input(s): "PROBNP" in the last 8760 hours. HbA1C: No results for input(s): "HGBA1C" in the last 72 hours.  CBG: Recent Labs  Lab 09/11/21 1604 09/11/21 2031 09/12/21 0036 09/12/21 0431 09/12/21 0723  GLUCAP 129* 103* 118* 91 85    Lipid Profile: Recent Labs    09/10/21 0139  CHOL 128  HDL 30*  LDLCALC 74  TRIG 122  CHOLHDL 4.3    Thyroid Function Tests: No results for input(s):  "TSH", "T4TOTAL", "FREET4", "T3FREE", "THYROIDAB" in the last 72 hours.  Anemia Panel: No results for input(s): "VITAMINB12", "FOLATE", "FERRITIN", "TIBC", "IRON", "RETICCTPCT" in the last 72 hours. Urine analysis:    Component Value Date/Time   COLORURINE YELLOW 09/07/2021 2305   APPEARANCEUR HAZY (A) 09/07/2021 2305   LABSPEC 1.010 09/07/2021 2305   PHURINE 5.0 09/07/2021 2305   GLUCOSEU 50 (A) 09/07/2021 2305   HGBUR SMALL (A) 09/07/2021 2305   BILIRUBINUR NEGATIVE 09/07/2021 2305   KETONESUR NEGATIVE 09/07/2021 2305   PROTEINUR 100 (A) 09/07/2021 2305   NITRITE NEGATIVE 09/07/2021 2305   LEUKOCYTESUR NEGATIVE 09/07/2021 2305   Recent Results (from the past 240 hour(s))  Resp Panel by RT-PCR (Flu A&B, Covid) Anterior Nasal Swab     Status: None   Collection Time: 09/07/21 11:19 PM   Specimen: Anterior Nasal Swab  Result Value Ref Range Status   SARS  Coronavirus 2 by RT PCR NEGATIVE NEGATIVE Final    Comment: (NOTE) SARS-CoV-2 target nucleic acids are NOT DETECTED.  The SARS-CoV-2 RNA is generally detectable in upper respiratory specimens during the acute phase of infection. The lowest concentration of SARS-CoV-2 viral copies this assay can detect is 138 copies/mL. A negative result does not preclude SARS-Cov-2 infection and should not be used as the sole basis for treatment or other patient management decisions. A negative result may occur with  improper specimen collection/handling, submission of specimen other than nasopharyngeal swab, presence of viral mutation(s) within the areas targeted by this assay, and inadequate number of viral copies(<138 copies/mL). A negative result must be combined with clinical observations, patient history, and epidemiological information. The expected result is Negative.  Fact Sheet for Patients:  EntrepreneurPulse.com.au  Fact Sheet for Healthcare Providers:  IncredibleEmployment.be  This test is  no t yet approved or cleared by the [place] FDA and  has been authorized for detection and/or diagnosis of SARS-CoV-2 by FDA under an Emergency Use Authorization (EUA). This EUA will remain  in effect (meaning this test can be used) for the duration of the COVID-19 declaration under Section 564(b)(1) of the Act, 21 U.S.C.section 360bbb-3(b)(1), unless the authorization is terminated  or revoked sooner.       Influenza A by PCR NEGATIVE NEGATIVE Final   Influenza B by PCR NEGATIVE NEGATIVE Final    Comment: (NOTE) The Xpert Xpress SARS-CoV-2/FLU/RSV plus assay is intended as an aid in the diagnosis of influenza from Nasopharyngeal swab specimens and should not be used as a sole basis for treatment. Nasal washings and aspirates are unacceptable for Xpert Xpress SARS-CoV-2/FLU/RSV testing.  Fact Sheet for Patients: EntrepreneurPulse.com.au  Fact Sheet for Healthcare Providers: IncredibleEmployment.be  This test is not yet approved or cleared by the [place] FDA and has been authorized for detection and/or diagnosis of SARS-CoV-2 by FDA under an Emergency Use Authorization (EUA). This EUA will remain in effect (meaning this test can be used) for the duration of the COVID-19 declaration under Section 564(b)(1) of the Act, 21 U.S.C. section 360bbb-3(b)(1), unless the authorization is terminated or revoked.  Performed at [facility], [place] [address] [place], [place] [address]   Culture, blood (routine x 2)     Status: None (Preliminary result)   Collection Time: 09/07/21 11:25 PM   Specimen: BLOOD  Result Value Ref Range Status   Specimen Description BLOOD RIGHT ANTECUBITAL  Final   Special Requests   Final    BOTTLES DRAWN AEROBIC AND ANAEROBIC Blood Culture results may not be optimal due to an excessive volume of blood received in culture bottles   Culture   Final    NO GROWTH 3 DAYS Performed at [facility],  [place] [address], [place], [place] [address]    Report Status PENDING  Incomplete  Culture, blood (routine x 2)     Status: None (Preliminary result)   Collection Time: 09/07/21 11:25 PM   Specimen: BLOOD LEFT HAND  Result Value Ref Range Status   Specimen Description BLOOD LEFT HAND  Final   Special Requests AEROBIC BOTTLE ONLY Blood Culture adequate volume  Final   Culture   Final    NO GROWTH 3 DAYS Performed at [facility], [place] [address] [place], [place] [address]    Report Status PENDING  Incomplete  MRSA Next Gen by PCR, Nasal     Status: None   Collection Time: 09/08/21  5:47 AM  Specimen: Nasal Mucosa; Nasal Swab  Result Value Ref Range Status   MRSA by PCR Next Gen NOT DETECTED NOT DETECTED Final    Comment: (NOTE) The GeneXpert MRSA Assay (FDA approved for NASAL specimens only), is one component of a comprehensive MRSA colonization surveillance program. It is not intended to diagnose MRSA infection nor to guide or monitor treatment for MRSA infections. Test performance is not FDA approved in patients less than 56 years old. Performed at [facility], [place] [address], [place], [place] [address]      ECHOCARDIOGRAM LIMITED  Result Date: 09/10/2021    ECHOCARDIOGRAM LIMITED REPORT   Patient Name:   Scott Grimes Date of Exam: 09/10/2021 Medical Rec #:  [id number]        Height:       71.0 in Accession #:    [id number]       Weight:       163.1 lb Date of Birth:  Nov 10, 1960        BSA:          1.933 m Patient Age:    49 years         BP:           118/78 mmHg Patient Gender: M                HR:           78 bpm. Exam Location:  Inpatient Procedure: Limited Echo Indications:    Elevated troponin  History:        Patient has prior history of Echocardiogram examinations, most                 recent 09/08/2021. Stroke, Arrythmias:Cardiac Arrest; Risk                 Factors:Current Smoker. Cocaine induced cardiac arrest. 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  Sonographer:    Merrie Roof RDCS Referring Phys: [id number] Oval  1. Limited study to assess LV function; full images not obtained. Basal inferior hypokinesis with overall preserved LV function.  2. Left ventricular ejection fraction, by estimation, is 60 to 65%. The left ventricle has normal function. The left ventricle demonstrates regional wall motion abnormalities (see scoring diagram/findings for description).  3. Right ventricular systolic function is mildly reduced. The right ventricular size is mildly enlarged.  4. The mitral valve is normal in structure. Mild mitral valve regurgitation.  5. The inferior vena cava is dilated in size with >50% respiratory variability, suggesting right atrial pressure of 8 mmHg. FINDINGS  Left Ventricle: Left ventricular ejection fraction, by estimation, is 60 to 65%. The left ventricle has normal function. The left ventricle demonstrates regional wall motion abnormalities. The left ventricular internal cavity size was normal in size. Right Ventricle: The right ventricular size is mildly enlarged. Right ventricular systolic function is mildly reduced. The tricuspid regurgitant velocity is 2.45 m/s, and with an assumed right atrial pressure of 8 mmHg, the estimated right ventricular systolic pressure is [id number]. Left Atrium: Left atrial size was normal in size. Right Atrium: Right atrial size was normal in size. Pericardium: There is no evidence of pericardial effusion. Mitral Valve: The mitral valve is normal in structure. Mild mitral valve regurgitation. Tricuspid Valve: The tricuspid valve is normal in structure. Tricuspid valve regurgitation is mild. Aorta: The  aortic root is normal in size and structure. Venous: The inferior vena cava is dilated in size with greater than 50% respiratory variability, suggesting right atrial pressure of 8 mmHg. Additional Comments: Limited study to assess LV function; full images not  obtained. Basal inferior hypokinesis with overall preserved LV function. RIGHT VENTRICLE          IVC RV Basal diam:  4.00 cm  IVC diam: 2.30 cm RV Mid diam:    2.70 cm TRICUSPID VALVE TR Peak grad:   24.0 mmHg TR Vmax:        245.00 cm/s Kirk Ruths MD Electronically signed by Kirk Ruths MD Signature Date/Time: 09/10/2021/12:36:49 PM    Final      Family Communication: None at bedside  Disposition: Status is: Inpatient Remains inpatient appropriate because: Awaiting updated PT/OT recommendations Planned Discharge Destination: To be determined  Little Ishikawa, MD 09/12/2021 8:07 AM Page by [email or web address]

## 2021-09-13 LAB — CULTURE, BLOOD (ROUTINE X 2)
Culture: NO GROWTH
Culture: NO GROWTH
Special Requests: ADEQUATE

## 2021-09-13 LAB — GLUCOSE, CAPILLARY
Glucose-Capillary: 98 mg/dL (ref 70–99)
Glucose-Capillary: 129 mg/dL — ABNORMAL HIGH (ref 70–99)

## 2021-09-13 MED ORDER — FOLIC ACID 1 MG PO TABS
1.0000 mg | ORAL_TABLET | Freq: Every day | ORAL | Status: DC
  Administered 2021-09-13 – 2021-09-16 (×4): 1 mg via ORAL
  Filled 2021-09-13 (×4): qty 1

## 2021-09-13 MED ORDER — THIAMINE HCL 100 MG PO TABS
100.0000 mg | ORAL_TABLET | Freq: Every day | ORAL | Status: DC
  Administered 2021-09-13 – 2021-09-16 (×4): 100 mg via ORAL
  Filled 2021-09-13 (×4): qty 1

## 2021-09-13 NOTE — Progress Notes (Signed)
Progress Note  Patient: Scott Grimes [id number] DOB: July 19, 1960  DOA: 09/07/2021  DOS: 09/13/2021    Brief hospital course: RAYMON GATEWOOD is a 61 y.o. male with a history of cocaine use, intellectual disability who presented by EMS to the ED on 6/7 after sustaining VT, then VF cardiac arrest after a witnessed seizure with ROSC after CPR and cardioversion. Lidocaine started by EMS and patient intubated in ED. Ultimately extubated and off pressors 6/8, continued on unasyn for aspiration pneumonia. Troponin was elevated, and heparin IV was given for 48 hours. Cardiology consulted and with improved LVEF, recommend no further inpatient investigations.   Clinically improving - no further inpatient workup per discussion with cardiology. Currently remains inpatient due to high risk discharge/unsafe disposition given ambulatory dysfunction and lack of dispo options given limited insurance/resources.  Assessment and Plan:  Ventricular tachycardia with out of hospital cardiac arrest: Due to cocaine use. No recurrence on amiodarone gtt, now in sinus bradycardia.  - Cardiology consulted. No further management recommended aside from cocaine cessation -signing off, appreciate their assistance in this case.  Acute systolic CHF: Improved by serial echocardiograms. No further work up or treatment recommended by cardiology.   Demand myocardial ischemia, type 2 NSTEMI:  - Repeat echo showed improvement w/LVEF ~60-65% with basal inferior hypokinesis. Cardiology advises against any further work up as an inpatient. Can follow up after discharge.  - Completed 48 hours IV heparin which was subsequently stopped due to fall risk (fell as inpatient 6/10)  - Continue aspirin, statin (LDL 74)  Acute toxic encephalopathy: On reportedly chronic borderline cognitive status. Possible contribution now from anoxic brain injury. - Delirium precautions - Substance cessation  Cocaine use and intoxication: +UDS, chronic  use.  - Cessation counseling provided - Avoid beta blockers, consider carvedilol if absolutely indicated.  Profound ambulatory dysfunction, multifactorial in the setting of above  -Unclear baseline ambulation at home but patient states he uses a walker or cane only as needed and not consistently.  He has been unable to tolerate PT here in any meaningful way until today he required essentially max assist with an ataxic gait and was notably unsteady.  Unclear if patient will be safe to discharge home at this time given ongoing ambulatory dysfunction. -Patient does not currently qualify for CIR, if he continues to be unstable would recommend SNF per PT discussion however if he continues to improve drastically over the next 24 to 48 hours he may be safe for discharge home with home health.  Hypophosphatemia: Resolved with supplementation  Hypoalbuminemia: Improve nutritional status.  Acute hypoxic respiratory failure: Due to suspected aspiration pneumonia and pneumonitis in setting of cardiac arrest on severe emphysema baseline: Resolved hypoxia.  - Continue unasyn x5 days.   LFT elevation: Resolved.  AKI: Resolved  Severe protein calorie malnutrition:  - Supplement protein as able  Tobacco use:  - Cessation counseling  Subjective: No acute issues or events overnight denies nausea vomiting diarrhea constipation headache fevers chills or chest pain  Objective: Vitals:   09/12/21 1015 09/12/21 1612 09/12/21 2105 09/13/21 0613  BP:  99/69 136/75 110/74  Pulse: 63 (!) 52 64 (!) 54  Resp:  18 18 17   Temp:  98.5 F (36.9 C) 98.3 F (36.8 C) 97.9 F (36.6 C)  TempSrc:  Oral Oral Oral  SpO2: 95% 95% 93% 92%  Weight:    69.9 kg  Height:       Gen: Chronically ill-appearing male in no distress Pulm: Nonlabored breathing room air.  Clear. CV: Regular rate and rhythm. No murmur, rub, or gallop. No JVD, no dependent edema. GI: Abdomen soft, non-tender, non-distended, with normoactive bowel  sounds.  Ext: Warm, no deformities Skin: No bleeding wounds or exudate.    Neuro: Awoke from sleep, rousable, not cooperative with full exam this morning but moves all extremities, follows commands.  Psych: Judgement and insight appear impaired.  Data Personally reviewed:  CBC: Recent Labs  Lab 09/07/21 2325 09/07/21 2350 09/08/21 0558 09/09/21 0141 09/10/21 0139 09/11/21 0039 09/12/21 0147  WBC 11.2*  --  10.2 10.4 7.8 7.0 6.8  NEUTROABS 4.6  --   --   --   --   --   --   HGB 15.1   < > 14.2 12.6* 11.2* 11.9* 12.8*  HCT 44.5   < > 40.8 37.9* 34.1* 35.5* 37.5*  MCV 98.5  --  93.4 95.7 95.3 94.2 93.3  PLT 368  --  297 251 256 275 288   < > = values in this interval not displayed.    Basic Metabolic Panel: Recent Labs  Lab 09/08/21 0558 09/09/21 0141 09/10/21 0139 09/11/21 0039 09/12/21 0147  NA 139 139 137 138 140  K 3.8 4.7 3.5 3.8 3.8  CL 106 109 106 107 111  CO2 22 26 28 24 22   GLUCOSE 108* 94 93 104* 92  BUN 11 10 7 8 9   CREATININE 1.11 0.94 1.04 1.08 1.19  CALCIUM 8.3* 8.2* 8.2* 8.1* 8.6*  MG 2.6* 2.3 1.9 1.8 2.0  PHOS 2.6 2.6 1.3* 4.0 3.5    GFR: Estimated Creatinine Clearance: 66.9 mL/min (by C-G formula based on SCr of 1.19 mg/dL). Liver Function Tests: Recent Labs  Lab 09/07/21 2325 09/08/21 0558 09/09/21 0141 09/10/21 0139  AST 88* 71* 44* 28  ALT 58* 54* 40 28  ALKPHOS 137* 125 98 81  BILITOT 0.6 0.5 0.5 0.4  PROT 5.9* 6.0* 5.6* 5.3*  ALBUMIN 3.2* 3.2* 2.9* 2.7*    Recent Labs  Lab 09/07/21 2325  LIPASE 35    Recent Labs  Lab 09/07/21 2325  AMMONIA 24    Coagulation Profile: Recent Labs  Lab 09/07/21 2325  INR 1.0    Cardiac Enzymes: No results for input(s): "CKTOTAL", "CKMB", "CKMBINDEX", "TROPONINI" in the last 168 hours. BNP (last 3 results) No results for input(s): "PROBNP" in the last 8760 hours. HbA1C: No results for input(s): "HGBA1C" in the last 72 hours.  CBG: Recent Labs  Lab 09/12/21 0723 09/12/21 1124  09/12/21 1613 09/12/21 2107 09/13/21 0737  GLUCAP 85 107* 113* 118* 98    Lipid Profile: No results for input(s): "CHOL", "HDL", "LDLCALC", "TRIG", "CHOLHDL", "LDLDIRECT" in the last 72 hours.  Thyroid Function Tests: No results for input(s): "TSH", "T4TOTAL", "FREET4", "T3FREE", "THYROIDAB" in the last 72 hours.  Anemia Panel: No results for input(s): "VITAMINB12", "FOLATE", "FERRITIN", "TIBC", "IRON", "RETICCTPCT" in the last 72 hours. Urine analysis:    Component Value Date/Time   COLORURINE YELLOW 09/07/2021 2305   APPEARANCEUR HAZY (A) 09/07/2021 2305   LABSPEC 1.010 09/07/2021 2305   PHURINE 5.0 09/07/2021 2305   GLUCOSEU 50 (A) 09/07/2021 2305   HGBUR SMALL (A) 09/07/2021 2305   BILIRUBINUR NEGATIVE 09/07/2021 2305   KETONESUR NEGATIVE 09/07/2021 2305   PROTEINUR 100 (A) 09/07/2021 2305   NITRITE NEGATIVE 09/07/2021 2305   LEUKOCYTESUR NEGATIVE 09/07/2021 2305   Recent Results (from the past 240 hour(s))  Resp Panel by RT-PCR (Flu A&B, Covid) Anterior Nasal Swab  Status: None   Collection Time: 09/07/21 11:19 PM   Specimen: Anterior Nasal Swab  Result Value Ref Range Status   SARS Coronavirus 2 by RT PCR NEGATIVE NEGATIVE Final    Comment: (NOTE) SARS-CoV-2 target nucleic acids are NOT DETECTED.  The SARS-CoV-2 RNA is generally detectable in upper respiratory specimens during the acute phase of infection. The lowest concentration of SARS-CoV-2 viral copies this assay can detect is 138 copies/mL. A negative result does not preclude SARS-Cov-2 infection and should not be used as the sole basis for treatment or other patient management decisions. A negative result may occur with  improper specimen collection/handling, submission of specimen other than nasopharyngeal swab, presence of viral mutation(s) within the areas targeted by this assay, and inadequate number of viral copies(<138 copies/mL). A negative result must be combined with clinical observations,  patient history, and epidemiological information. The expected result is Negative.  Fact Sheet for Patients:  EntrepreneurPulse.com.au  Fact Sheet for Healthcare Providers:  IncredibleEmployment.be  This test is no t yet approved or cleared by the [place] FDA and  has been authorized for detection and/or diagnosis of SARS-CoV-2 by FDA under an Emergency Use Authorization (EUA). This EUA will remain  in effect (meaning this test can be used) for the duration of the COVID-19 declaration under Section 564(b)(1) of the Act, 21 U.S.C.section 360bbb-3(b)(1), unless the authorization is terminated  or revoked sooner.       Influenza A by PCR NEGATIVE NEGATIVE Final   Influenza B by PCR NEGATIVE NEGATIVE Final    Comment: (NOTE) The Xpert Xpress SARS-CoV-2/FLU/RSV plus assay is intended as an aid in the diagnosis of influenza from Nasopharyngeal swab specimens and should not be used as a sole basis for treatment. Nasal washings and aspirates are unacceptable for Xpert Xpress SARS-CoV-2/FLU/RSV testing.  Fact Sheet for Patients: EntrepreneurPulse.com.au  Fact Sheet for Healthcare Providers: IncredibleEmployment.be  This test is not yet approved or cleared by the [place] FDA and has been authorized for detection and/or diagnosis of SARS-CoV-2 by FDA under an Emergency Use Authorization (EUA). This EUA will remain in effect (meaning this test can be used) for the duration of the COVID-19 declaration under Section 564(b)(1) of the Act, 21 U.S.C. section 360bbb-3(b)(1), unless the authorization is terminated or revoked.  Performed at [facility], [place] [address], [place], [place] [address]   Culture, blood (routine x 2)     Status: None (Preliminary result)   Collection Time: 09/07/21 11:25 PM   Specimen: BLOOD  Result Value Ref Range Status   Specimen Description BLOOD RIGHT ANTECUBITAL  Final    Special Requests   Final    BOTTLES DRAWN AEROBIC AND ANAEROBIC Blood Culture results may not be optimal due to an excessive volume of blood received in culture bottles   Culture   Final    NO GROWTH 4 DAYS Performed at [facility], [place] [address], [place], [place] [address]    Report Status PENDING  Incomplete  Culture, blood (routine x 2)     Status: None (Preliminary result)   Collection Time: 09/07/21 11:25 PM   Specimen: BLOOD LEFT HAND  Result Value Ref Range Status   Specimen Description BLOOD LEFT HAND  Final   Special Requests AEROBIC BOTTLE ONLY Blood Culture adequate volume  Final   Culture   Final    NO GROWTH 4 DAYS Performed at [facility], [place] [address] [place], [place] [address]    Report Status  PENDING  Incomplete  MRSA Next Gen by PCR, Nasal     Status: None   Collection Time: 09/08/21  5:47 AM   Specimen: Nasal Mucosa; Nasal Swab  Result Value Ref Range Status   MRSA by PCR Next Gen NOT DETECTED NOT DETECTED Final    Comment: (NOTE) The GeneXpert MRSA Assay (FDA approved for NASAL specimens only), is one component of a comprehensive MRSA colonization surveillance program. It is not intended to diagnose MRSA infection nor to guide or monitor treatment for MRSA infections. Test performance is not FDA approved in patients less than 62 years old. Performed at [facility], [place] [address], [place], [place] [address]      No results found.   Family Communication: None at bedside  Disposition: Status is: Inpatient Remains inpatient appropriate because: Awaiting updated PT/OT recommendations Planned Discharge Destination: To be determined  Little Ishikawa, MD 09/13/2021 7:59 AM Page by [email or web address]

## 2021-09-13 NOTE — Progress Notes (Addendum)
Speech Language Pathology Treatment: Dysphagia  Patient Details Name: Scott Grimes MRN: [id number] DOB: 1960-09-08 Today's Date: 09/13/2021 Time: [id number] SLP Time Calculation (min) (ACUTE ONLY): 15 min  Assessment / Plan / Recommendation Clinical Impression  Pt seen for ongoing dysphagia management.  Pt reports he is eating without difficulty.  Pt was seated upright in bedside chair with good posture.  Pt maintained upright positioning throughout today's session. There were no clinical s/s of aspiration observed with any consistencies trialed today.  There was trace lingual residue with regular texture solid.  Pt states he is eating well and doesn't mind the cut up toast at breakfast, but would rather have regular texture foods.  He states the first thing he ate after he lost his teeth was a steak.  Pt consumes regular texture diet at baseline and tolerated regular solids well per MBS.  Recommend advancing diet texture.  SLP to follow for tolerance.   Recommend regular texture solids and nectar thick liquids.     HPI HPI: Pt is a 61 year old male admitted with Cardiac arrest- VT, VF- likley due to acute drug intoxication. Likely NSTEMI likely from demand. Acute encephalopathy, concern for anoxic vs toxic encephalopathy. Sister mentions baseline cognitive impairment and speech difficulties. Intubated for one day 6/7- 6/8.      SLP Plan  Continue with current plan of care      Recommendations for follow up therapy are one component of a multi-disciplinary discharge planning process, led by the attending physician.  Recommendations may be updated based on patient status, additional functional criteria and insurance authorization.    Recommendations  Diet recommendations: Regular;Thin liquid Liquids provided via: Cup;Straw Medication Administration: Whole meds with liquid Supervision: Patient able to self feed Compensations: Slow rate;Small sips/bites Postural Changes and/or Swallow  Maneuvers: Seated upright 90 degrees                Follow Up Recommendations:  (Continue ST at next level of care) Assistance recommended at discharge: Intermittent Supervision/Assistance SLP Visit Diagnosis: Dysphagia, oropharyngeal phase (R13.12) Plan: Continue with current plan of care           Kerrie Pleasure, MA, CCC-SLP Acute Rehabilitation Services Office: [phone] 09/13/2021, 11:01 AM

## 2021-09-13 NOTE — PMR Pre-admission (Shared)
PMR Admission Coordinator Pre-Admission Assessment  Patient: Scott Grimes is an 61 y.o., male MRN: [id number] DOB: 1960/08/08 Height: _0  (180.3 cm) Weight: 69.9 kg  Insurance Information HMO: ***    PPO: ***     PCP: ***     IPA: ***     80/20: ***     OTHER: *** PRIMARY: [employer] medicare      Policy#: [id number]      Subscriber: patient CM Name: ***      Phone#: ***     Fax#: *** Pre-Cert#: ***      Employer: *** Benefits:  Phone #: ***     Name: *** Irene Shipper. Date: ***     Deduct: ***      Out of Pocket Max: ***      Life Max: *** CIR: ***      SNF: *** Outpatient: ***     Co-Pay: *** Home Health: ***      Co-Pay: *** DME: ***     Co-Pay: *** Providers: *** SECONDARY: ***      Policy#: ***     Phone#: ***  Financial Counselor: ***      Phone#: ***  The "Data Collection Information Summary" for patients in Inpatient Rehabilitation Facilities with attached "Privacy Act [place] Records" was provided and verbally reviewed with: Patient  Emergency Contact Information Contact Information     Name Relation Home Work Mobile   [place] Son   [place] Mother   [phone]   Wilber Bihari   [phone]   Rubie Maid   [phone]       Current Medical History  Patient Admitting Diagnosis: Cardiac Arrest  History of Present Illness: Patient is a 61 year old male admitted to [facility] 09/07/21. Patient had witnessed seizure and remained unresponsive. Upon EMS arrival patient found to have VT and VF, CPR was initiated with cardioversion with ROSC upon admission.  Patient was intubated in the ED due to acute hypoxic respiratory failure due to aspiration pneumonia and pneumonitis in setting of cardiac arrest. Patient was extubated on 09/08/21. Patient with acute systolic CHF, improved and no further workup recommended.  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   Patient had demand myocardial ischemia, type 2 NSTEMI, had 48 hours heparin, now discontinued. Therapies recommending CIR.     Patient's medical record from [facility] has been reviewed by the rehabilitation admission coordinator and physician.  Past Medical History  Past Medical History:  Diagnosis Date   COVID-19 02/2021   Low back pain    Stroke [facility])     Has the patient had major surgery during 100 days prior to admission? No  Family History   family history is not on file.  Current Medications  Current Facility-Administered Medications:    0.9 %  sodium chloride infusion, 250 mL, Intravenous, Continuous, Corey Harold, NP, Stopped at 09/08/21 1130   acetaminophen (TYLENOL) tablet 650 mg, 650 mg, Oral, Q4H PRN, 650 mg at 09/11/21 2338 **OR** acetaminophen (TYLENOL) 160 MG/5ML solution 650 mg, 650 mg, Per Tube, Q4H PRN, 650 mg at 09/08/21 0918 **OR** acetaminophen (TYLENOL) suppository 650 mg, 650 mg, Rectal, Q4H PRN, Hunsucker, Bonna Gains, MD   aspirin chewable tablet 81 mg, 81 mg, Oral, Daily, Agarwala, Ravi, MD, 81 mg at 09/13/21 1003   chlorhexidine (PERIDEX) 0.12 % solution 15 mL, 15 mL, Mouth Rinse, BID, Hunsucker, Bonna Gains, MD, 15 mL at 09/13/21  1004   enoxaparin (LOVENOX) injection 40 mg, 40 mg, Subcutaneous, Q24H, Vance Gather B, MD, 40 mg at 33/29/51 8841   folic acid (FOLVITE) tablet 1 mg, 1 mg, Oral, Daily, Little Ishikawa, MD, 1 mg at 09/13/21 1004   ipratropium-albuterol (DUONEB) 0.5-2.5 (3) MG/3ML nebulizer solution 3 mL, 3 mL, Nebulization, Q6H PRN, Hunsucker, Bonna Gains, MD   MEDLINE mouth rinse, 15 mL, Mouth Rinse, q12n4p, Hunsucker, Bonna Gains, MD, 15 mL at 09/12/21 1600   multivitamin with minerals tablet 1 tablet, 1 tablet, Oral, Daily, Gleason, Otilio Carpen, PA-C, 1 tablet at 09/13/21 1003   polyethylene glycol (MIRALAX / GLYCOLAX) packet 17 g, 17 g, Per Tube, Daily, Corey Harold, NP, 17 g at 09/13/21 1004   rosuvastatin (CRESTOR) tablet 40 mg, 40 mg, Oral,  Daily, Gleason, Otilio Carpen, PA-C, 40 mg at 09/13/21 1003   thiamine tablet 100 mg, 100 mg, Oral, Daily, Little Ishikawa, MD, 100 mg at 09/13/21 1003  Patients Current Diet:  Diet Order             Diet regular Room service appropriate? Yes with Assist; Fluid consistency: Thin  Diet effective now                   Precautions / Restrictions Precautions Precautions: Fall Precaution Comments: Watch SpO2 (pt reports he does not wear baseline) Restrictions Weight Bearing Restrictions: No   Has the patient had 2 or more falls or a fall with injury in the past year? Yes  Prior Activity Level Household: ambulating with cane prior to admission  Prior Functional Level Self Care: Did the patient need help bathing, dressing, using the toilet or eating? Independent  Indoor Mobility: Did the patient need assistance with walking from room to room (with or without device)? Independent  Stairs: Did the patient need assistance with internal or external stairs (with or without device)? Unknown  Functional Cognition: Did the patient need help planning regular tasks such as shopping or remembering to take medications? Needed some help  Patient Information Are you of Hispanic, Latino/a,or Spanish origin?: A. No, not of Hispanic, Latino/a, or Spanish origin What is your race?: A. White Do you need or want an interpreter to communicate with a doctor or health care staff?: 0. No  Patient's Response To:  Health Literacy and Transportation Is the patient able to respond to health literacy and transportation needs?: Yes Health Literacy - How often do you need to have someone help you when you read instructions, pamphlets, or other written material from your doctor or pharmacy?: Never In the past 12 months, has lack of transportation kept you from medical appointments or from getting medications?: No In the past 12 months, has lack of transportation kept you from meetings, work, or from getting  things needed for daily living?: No  Development worker, international aid / [place] Devices/Equipment: Bedside commode/3-in-1, Grab bars around toilet, Shower chair with back, Environmental consultant (specify type) Home Equipment: Conservation officer, nature (2 wheels)  Prior Device Use: Indicate devices/aids used by the patient prior to current illness, exacerbation or injury? Walker  Current Functional Level Cognition  Overall Cognitive Status: Within Functional Limits for tasks assessed Current Attention Level: Focused Orientation Level: Oriented X4 Following Commands: Follows one step commands consistently Safety/Judgement: Decreased awareness of safety, Decreased awareness of deficits General Comments: pt with decreased processing and awareness    Extremity Assessment (includes Sensation/Coordination)  Upper Extremity Assessment: Overall WFL for tasks assessed  Lower Extremity Assessment: Defer to PT  evaluation    ADLs  Overall ADL's : Needs assistance/impaired Eating/Feeding: Set up, Sitting Grooming: Set up, Sitting Upper Body Bathing: Set up, Sitting Lower Body Bathing: Moderate assistance, Sit to/from stand, Sitting/lateral leans Upper Body Dressing : Set up, Sitting Lower Body Dressing: Moderate assistance, Sitting/lateral leans, Sit to/from stand General ADL Comments: Remained bed level due to cognition this session    Mobility  Overal bed mobility: Needs Assistance Bed Mobility: Supine to Sit Supine to sit: Min guard Sit to supine: Mod assist General bed mobility comments: HOB 20 degrees with cues for sequence and HOB 20 degrees with use of rail    Transfers  Overall transfer level: Needs assistance Equipment used: Rolling walker (2 wheels), 1 person hand held assist Transfers: Sit to/from Stand Sit to Stand: Min assist Bed to/from chair/wheelchair/BSC transfer type:: Stand pivot Stand pivot transfers: Max assist General transfer comment: pt able to stand from bed and recliner with min  assist for balance. Pt performed 5 repeated sit to stands in 50 sec with guarding for safety, cues for hand placement and to control descent    Ambulation / Gait / Stairs / Wheelchair Mobility  Ambulation/Gait Ambulation/Gait assistance: Herbalist (Feet): 150 Feet Assistive device: Rolling walker (2 wheels) Gait Pattern/deviations: Step-through pattern, Decreased stride length, Trunk flexed General Gait Details: attempting single HHA to initiate gait with pt reaching for additional support and with LOB requiring physical assist to correct. with bil UE support on RW pt with min assist for gait with cues for RW use, posture, direction and safety. Decreased stance and swing of LLE with pt reporting long standing nerve damage to LLE Gait velocity interpretation: 1.31 - 2.62 ft/sec, indicative of limited community ambulator Pre-gait activities: performed 2x side steps at EOB with RW and mod-maxA, heavy posterior lean and difficulty using RW    Posture / Balance Dynamic Sitting Balance Sitting balance - Comments: static sitting with guarding for safety EOB Balance Overall balance assessment: Needs assistance Sitting-balance support: No upper extremity supported Sitting balance-Leahy Scale: Fair Sitting balance - Comments: static sitting with guarding for safety EOB Postural control: Right lateral lean, Posterior lean Standing balance support: Single extremity supported, Bilateral upper extremity supported Standing balance-Leahy Scale: Fair Standing balance comment: posterior right lean with single and bil UE support in standing for gait. Static standing 2 min with minguard    Special needs/care consideration N/A   Previous Home Environment (from acute therapy documentation) Living Arrangements: Non-relatives/Friends  Lives With: Friend(s) Available Help at Discharge: Available 24 hours/day, Friend(s) Type of Home: House Home Layout: One level Home Access: Level  entry Entrance Stairs-Rails: None Entrance Stairs-Number of Steps: 4 stairs Bathroom Shower/Tub: Optometrist: Yes Home Care Services: No Additional Comments: Pt told OT he lives with "friends"  Discharge Living Setting Plans for Discharge Living Setting: House, Lives with (comment) (friends) Type of Home at Discharge: House Discharge Home Layout: One level Discharge Home Access: Level entry Discharge Bathroom Shower/Tub: Tub/shower unit Discharge Bathroom Accessibility: Yes Does the patient have any problems obtaining your medications?: No  Social/Family/Support Systems Anticipated Caregiver: friend, [name] Anticipated Caregiver's Contact Information: [phone] Ability/Limitations of Caregiver: Min A Caregiver Availability: 24/7 Discharge Plan Discussed with Primary Caregiver: Yes Is Caregiver In Agreement with Plan?: Yes Does Caregiver/Family have Issues with Lodging/Transportation while Pt is in Rehab?: No  Goals Patient/Family Goal for Rehab: to be supervision level with mobility and ADLs Expected length of stay: 7-10 Pt/Family Agrees to Admission and  willing to participate: Yes Program Orientation Provided & Reviewed with Pt/Caregiver Including Roles  & Responsibilities: Yes  Barriers to Discharge: Insurance for SNF coverage  Decrease burden of Care through IP rehab admission: N/A  Possible need for SNF placement upon discharge: Not anticipated  Patient Condition: I have reviewed medical records from [facility], spoken with  TOC , and patient. I met with patient at the bedside for inpatient rehabilitation assessment.  Patient will benefit from ongoing PT and OT, can actively participate in 3 hours of therapy a day 5 days of the week, and can make measurable gains during the admission.  Patient will also benefit from the coordinated team approach during an Inpatient Acute Rehabilitation admission.  The patient will  receive intensive therapy as well as Rehabilitation physician, nursing, social worker, and care management interventions.  Due to safety, disease management, medication administration, pain management, and patient education the patient requires 24 hour a day rehabilitation nursing.  The patient is currently *** with mobility and basic ADLs.  Discharge setting and therapy post discharge at home with outpatient is anticipated.  Patient has agreed to participate in the Acute Inpatient Rehabilitation Program and will admit ***.  Preadmission Screen Completed By:  Amanda Cockayne, 09/13/2021 3:37 PM ______________________________________________________________________   Discussed status with Dr. Marland Kitchen on *** at *** and received approval for admission today.  Admission Coordinator:  Houston Zapien, PT, time ***/Date ***   Assessment/Plan: Diagnosis: Does the need for close, 24 hr/day Medical supervision in concert with the patient's rehab needs make it unreasonable for this patient to be served in a less intensive setting? {yes_no_potentially:3041433} Co-Morbidities requiring supervision/potential complications: *** Due to {due AU:6333545}, does the patient require 24 hr/day rehab nursing? {yes_no_potentially:3041433} Does the patient require coordinated care of a physician, rehab nurse, PT, OT, and SLP to address physical and functional deficits in the context of the above medical diagnosis(es)? {yes_no_potentially:3041433} Addressing deficits in the following areas: {deficits:3041436} Can the patient actively participate in an intensive therapy program of at least 3 hrs of therapy 5 days a week? {yes_no_potentially:3041433} The potential for patient to make measurable gains while on inpatient rehab is {potential:3041437} Anticipated functional outcomes upon discharge from inpatient rehab: {functional outcomes:304600100} PT, {functional outcomes:304600100} OT, {functional outcomes:304600100}  SLP Estimated rehab length of stay to reach the above functional goals is: *** Anticipated discharge destination: {anticipated dc setting:21604} 10. Overall Rehab/Functional Prognosis: {potential:3041437}   MD Signature: ***

## 2021-09-13 NOTE — Progress Notes (Addendum)
  Inpatient Rehab Coordinator Note:  I met with Scott Grimes at bedside to discuss CIR recommendations and goals/expectations of CIR stay.  We reviewed 3 hrs/day of therapy, physician follow up, and average length of stay 2 weeks (dependent upon progress) with goals of supervision level with 24 hour supervision/assist. Patient is interested in this and I will contact his friend, Scott Grimes, whom he reports is who he will be staying with upon discharge.  Patient has [place] and I plan to contact his insurance carrier to get prior authorization. Will continue to follow.   Addendum: Spoke with patient's friend, Scott Grimes, who confirms she will be available 24/7 to assist with patient as needed upon his discharge home.   Amanda Cockayne, PT, [facility] Admissions Coordinator  09/13/21,12:01 PM

## 2021-09-14 LAB — GLUCOSE, CAPILLARY
Glucose-Capillary: 112 mg/dL — ABNORMAL HIGH (ref 70–99)
Glucose-Capillary: 114 mg/dL — ABNORMAL HIGH (ref 70–99)
Glucose-Capillary: 120 mg/dL — ABNORMAL HIGH (ref 70–99)
Glucose-Capillary: 96 mg/dL (ref 70–99)
Glucose-Capillary: 99 mg/dL (ref 70–99)

## 2021-09-14 MED ORDER — ENSURE ENLIVE PO LIQD
237.0000 mL | Freq: Three times a day (TID) | ORAL | Status: DC
  Administered 2021-09-14 – 2021-09-16 (×7): 237 mL via ORAL

## 2021-09-14 NOTE — Progress Notes (Signed)
Nutrition Follow-up  DOCUMENTATION CODES:   Severe malnutrition in context of social or environmental circumstances  INTERVENTION:    Continue to encourage PO intake  Ensure Enlive po TID, each supplement provides 350 kcal and 20 grams of protein.  Please re-consult as needed  NUTRITION DIAGNOSIS:   Severe Malnutrition related to social / environmental circumstances (drug abuse) as evidenced by severe muscle depletion, severe fat depletion. Ongoing.   GOAL:   Patient will meet greater than or equal to 90% of their needs Met with diet and supplements.   MONITOR:   Vent status, TF tolerance, Labs  REASON FOR ASSESSMENT:   Ventilator, Consult Enteral/tube feeding initiation and management  ASSESSMENT:   61 yo male admitted S/P witnessed seizure, VT then VF arrest (suspect r/t acute drug intoxication). PMH includes stroke, low back pain, COVID-19, tobacco abuse, drug abuse (meth), intellectual disability.   Diet advanced to Regular, Meal Completion: 100%.  CIR coordinator following for possible rehab after d/c.  Pt drinking an ensure during visit and reports that he likes them.  Pt confused recounting stories of drinking coffee all day.    Medications reviewed and include: folic acid, MVI with minerals, miralax, thiamine   Labs reviewed   Diet Order:   Diet Order             Diet regular Room service appropriate? Yes with Assist; Fluid consistency: Thin  Diet effective now                   EDUCATION NEEDS:   Not appropriate for education at this time  Skin:  Skin Assessment: Reviewed RN Assessment  Last BM:  no BM documented  Height:   Ht Readings from Last 1 Encounters:  09/07/21 5' 11"  (1.803 m)    Weight:   Wt Readings from Last 1 Encounters:  09/13/21 69.9 kg    Ideal Body Weight:  78.2 kg  BMI:  Body mass index is 21.49 kg/m.  Estimated Nutritional Needs:   Kcal:  2000-2200  Protein:  110-130 gm  Fluid:  2-2.2  L  Capone Schwinn P., RD, LDN, CNSC See AMiON for contact information

## 2021-09-14 NOTE — Progress Notes (Signed)
Inpatient Rehab Admissions Coordinator:   Awaiting determination from [facility] Medicare regarding CIR prior auth request.  Will continue to follow.   Estill Dooms, PT, DPT Admissions Coordinator [phone] 09/14/21  10:30 AM

## 2021-09-14 NOTE — Progress Notes (Signed)
Physical Therapy Treatment Patient Details Name: Scott Grimes MRN: [id number] DOB: 18-Sep-1960 Today's Date: 09/14/2021   History of Present Illness 61 y.o. male admitted 09/07/21 with witnessed seizure, remained unresponsive, found to have VT then VF by EMS requiring cardioversion. Pt with acute encephalopathy, concern for anoxic vs toxic encephalopathy; UDS (+) cocaine. ETT 6/7-6/8. EEG 6/8 suggestive of moderate to severe diffuse encephalopathy, nonspecific etiology; no seizures noted. PMHx: chronic back pain, polysubstance abuse, stroke, intellectual disability.    PT Comments    Pt was seen for mobility after assisting OOB today, and noted his effortful walk at the end of the session.  Pt is demonstrating some weakness on LLE with giving out of knee, along with an IR collapsing of the leg.  Pt is reporting an old injury that has plagued him, mainly with LE stability.  Follow up with him for progression of acute PT goals, and will anticipate discharge to CIR at the end of hosp stay.  Recommending due to the multitude of symptoms, and the intensity of therapy needed to address them.   Recommendations for follow up therapy are one component of a multi-disciplinary discharge planning process, led by the attending physician.  Recommendations may be updated based on patient status, additional functional criteria and insurance authorization.  Follow Up Recommendations  Acute inpatient rehab (3hours/day)     Assistance Recommended at Discharge Frequent or constant Supervision/Assistance  Patient can return home with the following A little help with walking and/or transfers;A little help with bathing/dressing/bathroom;Assistance with cooking/housework;Direct supervision/assist for medications management;Direct supervision/assist for financial management;Assist for transportation;Help with stairs or ramp for entrance   Equipment Recommendations  Rolling walker (2 wheels);BSC/3in1     Recommendations for Other Services Rehab consult     Precautions / Restrictions Precautions Precautions: Fall Precaution Comments: Watch SpO2 (pt reports he does not wear baseline) Restrictions Weight Bearing Restrictions: No     Mobility  Bed Mobility Overal bed mobility: Needs Assistance Bed Mobility: Supine to Sit     Supine to sit: Min guard     General bed mobility comments: using bed rail for getting OOB    Transfers Overall transfer level: Needs assistance Equipment used: Rolling walker (2 wheels), 1 person hand held assist Transfers: Sit to/from Stand Sit to Stand: Min guard, Min assist           General transfer comment: mild impulsivity with cues for hand placement needed    Ambulation/Gait Ambulation/Gait assistance: Min guard Gait Distance (Feet): 175 Feet Assistive device: Rolling walker (2 wheels) Gait Pattern/deviations: Step-through pattern, Decreased stride length, Wide base of support Gait velocity: reduced Gait velocity interpretation: <1.31 ft/sec, indicative of household ambulator Pre-gait activities: standing balance correction General Gait Details: tends to IR L hip due to injuries from awhile ago   Stairs             Wheelchair Mobility    Modified Rankin (Stroke Patients Only)       Balance Overall balance assessment: Needs assistance Sitting-balance support: Feet supported Sitting balance-Leahy Scale: Fair     Standing balance support: Bilateral upper extremity supported, During functional activity Standing balance-Leahy Scale: Poor Standing balance comment: lists to L side due to injury without support                            Cognition Arousal/Alertness: Awake/alert Behavior During Therapy: WFL for tasks assessed/performed Overall Cognitive Status: Within Functional Limits for tasks assessed Area of  Impairment: Problem solving, Awareness                           Awareness:  Intellectual Problem Solving: Slow processing          Exercises General Exercises - Lower Extremity Ankle Circles/Pumps: AAROM, 5 reps Quad Sets: AROM, 10 reps Gluteal Sets: AROM, 10 reps    General Comments General comments (skin integrity, edema, etc.): pt is geting up to walk from bedside adn is motivated to walk a longer trip.  Tending to fatigue on LLE and requires PT to set the limit due to fall risk      Pertinent Vitals/Pain Pain Assessment Pain Assessment: No/denies pain Faces Pain Scale: Hurts little more Pain Location: stiffness generally esp hips Pain Descriptors / Indicators: Guarding Pain Intervention(s): Limited activity within patient's tolerance, Monitored during session, Repositioned    Home Living                          Prior Function            PT Goals (current goals can now be found in the care plan section) Acute Rehab PT Goals Patient Stated Goal: get stronger and out Progress towards PT goals: Progressing toward goals    Frequency    Min 3X/week      PT Plan Current plan remains appropriate;Equipment recommendations need to be updated    Co-evaluation              AM-PAC PT "6 Clicks" Mobility   Outcome Measure  Help needed turning from your back to your side while in a flat bed without using bedrails?: A Little Help needed moving from lying on your back to sitting on the side of a flat bed without using bedrails?: A Little Help needed moving to and from a bed to a chair (including a wheelchair)?: A Little Help needed standing up from a chair using your arms (e.g., wheelchair or bedside chair)?: A Little Help needed to walk in hospital room?: A Little Help needed climbing 3-5 steps with a railing? : A Lot 6 Click Score: 17    End of Session Equipment Utilized During Treatment: Gait belt Activity Tolerance: Patient tolerated treatment well;Other (comment) Patient left: in chair;with call bell/phone within  reach;with chair alarm set;with restraints reapplied (velcro chair belt) Nurse Communication: Mobility status PT Visit Diagnosis: Other abnormalities of gait and mobility (R26.89);Muscle weakness (generalized) (M62.81);Difficulty in walking, not elsewhere classified (R26.2)     Time: [id number] PT Time Calculation (min) (ACUTE ONLY): 27 min  Charges:  $Gait Training: 8-22 mins $Therapeutic Exercise: 8-22 mins Ramond Dial 09/14/2021, 5:11 PM  Mee Hives, PT PhD Acute Rehab Dept. Number: [place]

## 2021-09-14 NOTE — Progress Notes (Signed)
Progress Note  Patient: Scott Grimes [id number] DOB: Mar 24, 1961  DOA: 09/07/2021  DOS: 09/14/2021    Brief hospital course: Scott Grimes is a 61 y.o. male with a history of cocaine use, intellectual disability who presented by EMS to the ED on 6/7 after sustaining VT, then VF cardiac arrest after a witnessed seizure with ROSC after CPR and cardioversion. Lidocaine started by EMS and patient intubated in ED. Ultimately extubated and off pressors 6/8, continued on unasyn for aspiration pneumonia. Troponin was elevated, and heparin IV was given for 48 hours. Cardiology consulted and with improved LVEF, recommend no further inpatient investigations.   Clinically improving - no further inpatient workup per discussion with cardiology. Currently remains inpatient due to high risk discharge/unsafe disposition given ambulatory dysfunction and lack of dispo options given limited insurance/resources.  Assessment and Plan:  Ventricular tachycardia with out of hospital cardiac arrest: Due to cocaine use. No recurrence on amiodarone gtt, now in sinus bradycardia.  - Cardiology consulted. No further management recommended aside from cocaine cessation -signing off, appreciate their assistance in this case.  Acute systolic CHF: Improved by serial echocardiograms. No further work up or treatment recommended by cardiology.   Demand myocardial ischemia, type 2 NSTEMI:  - Repeat echo showed improvement w/LVEF ~60-65% with basal inferior hypokinesis. Cardiology advises against any further work up as an inpatient. Can follow up after discharge.  - Completed 48 hours IV heparin which was subsequently stopped due to fall risk (fell as inpatient 6/10)  - Continue aspirin, statin (LDL 74)  Acute toxic encephalopathy: On reportedly chronic borderline cognitive status. Possible contribution now from anoxic brain injury. - Delirium precautions - Substance cessation  Cocaine use and intoxication: +UDS, chronic  use.  - Cessation counseling provided - Avoid beta blockers, consider carvedilol if absolutely indicated.  Profound ambulatory dysfunction, multifactorial in the setting of above  -Unclear baseline ambulation at home but patient states he uses a walker or cane only as needed and not consistently.  He has been unable to tolerate PT here in any meaningful way until today he required essentially max assist with an ataxic gait and was notably unsteady.  Unclear if patient will be safe to discharge home at this time given ongoing ambulatory dysfunction. -Patient does not currently qualify for CIR, if he continues to be unstable would recommend SNF per PT discussion however if he continues to improve drastically over the next 24 to 48 hours he may be safe for discharge home with home health.  Hypophosphatemia: Resolved with supplementation  Hypoalbuminemia: Improve nutritional status.  Acute hypoxic respiratory failure: Due to suspected aspiration pneumonia and pneumonitis in setting of cardiac arrest on severe emphysema baseline: Resolved hypoxia.  - Continue unasyn x5 days.   LFT elevation: Resolved.  AKI: Resolved  Severe protein calorie malnutrition:  - Supplement protein as able  Tobacco use:  - Cessation counseling  Subjective: No acute issues or events overnight denies nausea vomiting diarrhea constipation headache fevers chills or chest pain  Objective: Vitals:   09/13/21 1340 09/13/21 1414 09/13/21 1434 09/13/21 1951  BP:  114/78 123/87 104/66  Pulse:  62 64 60  Resp:  16 18 18   Temp: 97.7 F (36.5 C)  97.8 F (36.6 C) (!) 97.4 F (36.3 C)  TempSrc: Oral  Oral Oral  SpO2:  95% 99% 97%  Weight:      Height:       Gen: Chronically ill-appearing male in no distress Pulm: Nonlabored breathing room air. Clear. CV:  Regular rate and rhythm. No murmur, rub, or gallop. No JVD, no dependent edema. GI: Abdomen soft, non-tender, non-distended, with normoactive bowel sounds.  Ext:  Warm, no deformities Skin: No bleeding wounds or exudate.    Neuro: Awoke from sleep, rousable, not cooperative with full exam this morning but moves all extremities, follows commands.  Psych: Judgement and insight appear impaired.  Data Personally reviewed:  CBC: Recent Labs  Lab 09/07/21 2325 09/07/21 2350 09/08/21 0558 09/09/21 0141 09/10/21 0139 09/11/21 0039 09/12/21 0147  WBC 11.2*  --  10.2 10.4 7.8 7.0 6.8  NEUTROABS 4.6  --   --   --   --   --   --   HGB 15.1   < > 14.2 12.6* 11.2* 11.9* 12.8*  HCT 44.5   < > 40.8 37.9* 34.1* 35.5* 37.5*  MCV 98.5  --  93.4 95.7 95.3 94.2 93.3  PLT 368  --  297 251 256 275 288   < > = values in this interval not displayed.    Basic Metabolic Panel: Recent Labs  Lab 09/08/21 0558 09/09/21 0141 09/10/21 0139 09/11/21 0039 09/12/21 0147  NA 139 139 137 138 140  K 3.8 4.7 3.5 3.8 3.8  CL 106 109 106 107 111  CO2 22 26 28 24 22   GLUCOSE 108* 94 93 104* 92  BUN 11 10 7 8 9   CREATININE 1.11 0.94 1.04 1.08 1.19  CALCIUM 8.3* 8.2* 8.2* 8.1* 8.6*  MG 2.6* 2.3 1.9 1.8 2.0  PHOS 2.6 2.6 1.3* 4.0 3.5    GFR: Estimated Creatinine Clearance: 66.9 mL/min (by C-G formula based on SCr of 1.19 mg/dL). Liver Function Tests: Recent Labs  Lab 09/07/21 2325 09/08/21 0558 09/09/21 0141 09/10/21 0139  AST 88* 71* 44* 28  ALT 58* 54* 40 28  ALKPHOS 137* 125 98 81  BILITOT 0.6 0.5 0.5 0.4  PROT 5.9* 6.0* 5.6* 5.3*  ALBUMIN 3.2* 3.2* 2.9* 2.7*    Recent Labs  Lab 09/07/21 2325  LIPASE 35    Recent Labs  Lab 09/07/21 2325  AMMONIA 24    Coagulation Profile: Recent Labs  Lab 09/07/21 2325  INR 1.0    Cardiac Enzymes: No results for input(s): "CKTOTAL", "CKMB", "CKMBINDEX", "TROPONINI" in the last 168 hours. BNP (last 3 results) No results for input(s): "PROBNP" in the last 8760 hours. HbA1C: No results for input(s): "HGBA1C" in the last 72 hours.  CBG: Recent Labs  Lab 09/12/21 1613 09/12/21 2107 09/13/21 0737  09/13/21 1156 09/14/21 0017  GLUCAP 113* 118* 98 129* 96    Lipid Profile: No results for input(s): "CHOL", "HDL", "LDLCALC", "TRIG", "CHOLHDL", "LDLDIRECT" in the last 72 hours.  Thyroid Function Tests: No results for input(s): "TSH", "T4TOTAL", "FREET4", "T3FREE", "THYROIDAB" in the last 72 hours.  Anemia Panel: No results for input(s): "VITAMINB12", "FOLATE", "FERRITIN", "TIBC", "IRON", "RETICCTPCT" in the last 72 hours. Urine analysis:    Component Value Date/Time   COLORURINE YELLOW 09/07/2021 2305   APPEARANCEUR HAZY (A) 09/07/2021 2305   LABSPEC 1.010 09/07/2021 2305   PHURINE 5.0 09/07/2021 2305   GLUCOSEU 50 (A) 09/07/2021 2305   HGBUR SMALL (A) 09/07/2021 2305   BILIRUBINUR NEGATIVE 09/07/2021 2305   KETONESUR NEGATIVE 09/07/2021 2305   PROTEINUR 100 (A) 09/07/2021 2305   NITRITE NEGATIVE 09/07/2021 2305   LEUKOCYTESUR NEGATIVE 09/07/2021 2305   Recent Results (from the past 240 hour(s))  Resp Panel by RT-PCR (Flu A&B, Covid) Anterior Nasal Swab     Status: None  Collection Time: 09/07/21 11:19 PM   Specimen: Anterior Nasal Swab  Result Value Ref Range Status   SARS Coronavirus 2 by RT PCR NEGATIVE NEGATIVE Final    Comment: (NOTE) SARS-CoV-2 target nucleic acids are NOT DETECTED.  The SARS-CoV-2 RNA is generally detectable in upper respiratory specimens during the acute phase of infection. The lowest concentration of SARS-CoV-2 viral copies this assay can detect is 138 copies/mL. A negative result does not preclude SARS-Cov-2 infection and should not be used as the sole basis for treatment or other patient management decisions. A negative result may occur with  improper specimen collection/handling, submission of specimen other than nasopharyngeal swab, presence of viral mutation(s) within the areas targeted by this assay, and inadequate number of viral copies(<138 copies/mL). A negative result must be combined with clinical observations, patient history,  and epidemiological information. The expected result is Negative.  Fact Sheet for Patients:  EntrepreneurPulse.com.au  Fact Sheet for Healthcare Providers:  IncredibleEmployment.be  This test is no t yet approved or cleared by the [place] FDA and  has been authorized for detection and/or diagnosis of SARS-CoV-2 by FDA under an Emergency Use Authorization (EUA). This EUA will remain  in effect (meaning this test can be used) for the duration of the COVID-19 declaration under Section 564(b)(1) of the Act, 21 U.S.C.section 360bbb-3(b)(1), unless the authorization is terminated  or revoked sooner.       Influenza A by PCR NEGATIVE NEGATIVE Final   Influenza B by PCR NEGATIVE NEGATIVE Final    Comment: (NOTE) The Xpert Xpress SARS-CoV-2/FLU/RSV plus assay is intended as an aid in the diagnosis of influenza from Nasopharyngeal swab specimens and should not be used as a sole basis for treatment. Nasal washings and aspirates are unacceptable for Xpert Xpress SARS-CoV-2/FLU/RSV testing.  Fact Sheet for Patients: EntrepreneurPulse.com.au  Fact Sheet for Healthcare Providers: IncredibleEmployment.be  This test is not yet approved or cleared by the [place] FDA and has been authorized for detection and/or diagnosis of SARS-CoV-2 by FDA under an Emergency Use Authorization (EUA). This EUA will remain in effect (meaning this test can be used) for the duration of the COVID-19 declaration under Section 564(b)(1) of the Act, 21 U.S.C. section 360bbb-3(b)(1), unless the authorization is terminated or revoked.  Performed at [facility], [place] [address] [place], [place] [address]   Culture, blood (routine x 2)     Status: None   Collection Time: 09/07/21 11:25 PM   Specimen: BLOOD  Result Value Ref Range Status   Specimen Description BLOOD RIGHT ANTECUBITAL  Final   Special Requests   Final     BOTTLES DRAWN AEROBIC AND ANAEROBIC Blood Culture results may not be optimal due to an excessive volume of blood received in culture bottles   Culture   Final    NO GROWTH 5 DAYS Performed at [facility], [place] [address] [place], [place] [address]    Report Status 09/13/2021 FINAL  Final  Culture, blood (routine x 2)     Status: None   Collection Time: 09/07/21 11:25 PM   Specimen: BLOOD LEFT HAND  Result Value Ref Range Status   Specimen Description BLOOD LEFT HAND  Final   Special Requests AEROBIC BOTTLE ONLY Blood Culture adequate volume  Final   Culture   Final    NO GROWTH 5 DAYS Performed at [facility], [place] [address] [place],  [address]    Report Status 09/13/2021 FINAL  Final  MRSA Next  Gen by PCR, Nasal     Status: None   Collection Time: 09/08/21  5:47 AM   Specimen: Nasal Mucosa; Nasal Swab  Result Value Ref Range Status   MRSA by PCR Next Gen NOT DETECTED NOT DETECTED Final    Comment: (NOTE) The GeneXpert MRSA Assay (FDA approved for NASAL specimens only), is one component of a comprehensive MRSA colonization surveillance program. It is not intended to diagnose MRSA infection nor to guide or monitor treatment for MRSA infections. Test performance is not FDA approved in patients less than 49 years old. Performed at [facility], [place] [address], [place], [place] [address]      No results found.   Family Communication: None at bedside  Disposition: Status is: Inpatient Remains inpatient appropriate because: Awaiting updated PT/OT recommendations Planned Discharge Destination: To be determined  Little Ishikawa, MD 09/14/2021 7:19 AM Page by [email or web address]

## 2021-09-14 NOTE — TOC Progression Note (Signed)
Transition of Care [facility]) - Progression Note    Patient Details  Name: Scott Grimes MRN: [id number] Date of Birth: [birth year]  Transition of Care [facility]) CM/SW Contact  Janae Bridgeman, RN Phone Number: 09/14/2021, 3:00 PM  Clinical Narrative:    CM and MSW with DTP is continuing to follow the patient for TOC needs.  CIR is continuing to follow the patient for bed offer.   Expected Discharge Plan: IP Rehab Facility Barriers to Discharge: Inadequate or no insurance  Expected Discharge Plan and Services Expected Discharge Plan: IP Rehab Facility   Discharge Planning Services: CM Consult   Living arrangements for the past 2 months: Single Family Home                                       Social Determinants of Health (SDOH) Interventions    Readmission Risk Interventions     No data to display

## 2021-09-15 LAB — GLUCOSE, CAPILLARY
Glucose-Capillary: 86 mg/dL (ref 70–99)
Glucose-Capillary: 75 mg/dL (ref 70–99)
Glucose-Capillary: 104 mg/dL — ABNORMAL HIGH (ref 70–99)
Glucose-Capillary: 127 mg/dL — ABNORMAL HIGH (ref 70–99)

## 2021-09-15 MED ORDER — FOLIC ACID 1 MG PO TABS: 1.0000 mg | ORAL_TABLET | Freq: Every day | ORAL | 11 refills | Status: AC

## 2021-09-15 MED ORDER — THIAMINE HCL 100 MG PO TABS: 100.0000 mg | ORAL_TABLET | Freq: Every day | ORAL | 11 refills | Status: AC

## 2021-09-15 MED ORDER — ENSURE ENLIVE PO LIQD: 237.0000 mL | Freq: Three times a day (TID) | ORAL | 12 refills | Status: AC

## 2021-09-15 MED ORDER — ADULT MULTIVITAMIN W/MINERALS CH: 1.0000 | ORAL_TABLET | Freq: Every day | ORAL | 11 refills | Status: AC

## 2021-09-15 MED ORDER — ROSUVASTATIN CALCIUM 40 MG PO TABS: 40.0000 mg | ORAL_TABLET | Freq: Every day | ORAL | 0 refills | Status: AC

## 2021-09-15 MED ORDER — ASPIRIN 81 MG PO CHEW: 81.0000 mg | CHEWABLE_TABLET | Freq: Every day | ORAL | 0 refills | Status: AC

## 2021-09-15 NOTE — Progress Notes (Signed)
Physical Therapy Treatment Patient Details Name: Scott Grimes MRN: [id number] DOB: June 10, 1960 Today's Date: 09/15/2021   History of Present Illness 61 y.o. male admitted 09/07/21 with witnessed seizure, remained unresponsive, found to have VT then VF by EMS requiring cardioversion. Pt with acute encephalopathy, concern for anoxic vs toxic encephalopathy; UDS (+) cocaine. ETT 6/7-6/8. EEG 6/8 suggestive of moderate to severe diffuse encephalopathy, nonspecific etiology; no seizures noted. PMHx: chronic back pain, polysubstance abuse, stroke, intellectual disability.    PT Comments    Pt received in chair, pleasantly agreeable to therapy session and with good participation and tolerance for hallway gait progression using RW and transfer training. Reviewed fall risk prevention with pt and sequencing/body mechanics for getting up off floor in event of fall, pt receptive and reports this is his typical technique when given demo. Pt mostly Supervision for gait with RW, 1 episode min guard with mild LOB but pt self-corrects with RW support. Anticipate pt at baseline and he reports nearly 24/7 supervision/assist available from friend Melissa who he lives with, pt agreeable to home with HHPT for home safety assessment and progression of safe mobility/balance. Disposition/DME updated per discussion with pt, supervising PT Jaclyn R and case mgr Marcelino Duster notified. Pt continues to benefit from PT services to progress toward functional mobility goals.    Recommendations for follow up therapy are one component of a multi-disciplinary discharge planning process, led by the attending physician.  Recommendations may be updated based on patient status, additional functional criteria and insurance authorization.  Follow Up Recommendations  Home health PT     Assistance Recommended at Discharge Frequent or constant Supervision/Assistance  Patient can return home with the following A little help with walking and/or  transfers;A little help with bathing/dressing/bathroom;Assistance with cooking/housework;Direct supervision/assist for medications management;Direct supervision/assist for financial management;Assist for transportation;Help with stairs or ramp for entrance   Equipment Recommendations  Rolling walker (2 wheels) (tub bench would likely be safer for him than 3in1, discussed with OT/SW)    Recommendations for Other Services       Precautions / Restrictions Precautions Precautions: Fall Restrictions Weight Bearing Restrictions: No     Mobility  Bed Mobility               General bed mobility comments: pt received in chair    Transfers Overall transfer level: Needs assistance Equipment used: Rolling walker (2 wheels) Transfers: Sit to/from Stand Sit to Stand: Min guard           General transfer comment: min guard with cues for safe hand placement, pt safer with stand>sit transfer    Ambulation/Gait Ambulation/Gait assistance: Supervision, Min guard Gait Distance (Feet): 200 Feet Assistive device: Rolling walker (2 wheels) Gait Pattern/deviations: Step-through pattern, Decreased stride length, Decreased weight shift to left, Antalgic, Narrow base of support       General Gait Details: tends to IR L hip due to chronic LLE injury, min cues for wider BOS to avoid near-scissoring steps and pt reports chronic tendon/nerve injury, likely at his baseline; minor LOB initially needing min guard pt corrects with RW, otherwise Supervision       Balance Overall balance assessment: Needs assistance Sitting-balance support: Feet supported Sitting balance-Leahy Scale: Fair     Standing balance support: Bilateral upper extremity supported, Reliant on assistive device for balance Standing balance-Leahy Scale: Poor Standing balance comment: able to stand at sink for grooming tasks using one extremity, no LOB using RW after mild LOB initially stepping, pt self-corrected with RW  Cognition Arousal/Alertness: Awake/alert Behavior During Therapy: WFL for tasks assessed/performed Overall Cognitive Status: Within Functional Limits for tasks assessed Area of Impairment: Problem solving, Safety/judgement                 Orientation Level:  (A&O to self, situation, plan for DC today, place) Current Attention Level: Focused   Following Commands: Follows one step commands consistently Safety/Judgement: Decreased awareness of safety Awareness: Intellectual Problem Solving: Slow processing, Requires verbal cues General Comments: min verbal cues for safety, mostly with transfers (hand placement) and for static standing safety (pt abandons RW when close to sink to brush hair        Exercises      General Comments General comments (skin integrity, edema, etc.): VSS on RA, no acute s/sx distress      Pertinent Vitals/Pain Pain Assessment Pain Assessment: Faces Faces Pain Scale: Hurts a little bit Pain Location: L knee (baseline-chronic) Pain Descriptors / Indicators: Guarding, Discomfort Pain Intervention(s): Monitored during session           PT Goals (current goals can now be found in the care plan section) Acute Rehab PT Goals Patient Stated Goal: get stronger and go home PT Goal Formulation: With patient Time For Goal Achievement: 09/23/21 Progress towards PT goals: Progressing toward goals    Frequency    Min 3X/week      PT Plan Equipment recommendations need to be updated;Discharge plan needs to be updated       AM-PAC PT "6 Clicks" Mobility   Outcome Measure  Help needed turning from your back to your side while in a flat bed without using bedrails?: None Help needed moving from lying on your back to sitting on the side of a flat bed without using bedrails?: A Little Help needed moving to and from a bed to a chair (including a wheelchair)?: A Little Help needed standing up from a chair using  your arms (e.g., wheelchair or bedside chair)?: A Little Help needed to walk in hospital room?: A Little Help needed climbing 3-5 steps with a railing? : A Lot 6 Click Score: 18    End of Session Equipment Utilized During Treatment: Gait belt Activity Tolerance: Patient tolerated treatment well;Other (comment) Patient left: in chair;with call bell/phone within reach;with chair alarm set Nurse Communication: Mobility status PT Visit Diagnosis: Other abnormalities of gait and mobility (R26.89);Muscle weakness (generalized) (M62.81);Difficulty in walking, not elsewhere classified (R26.2)     Time: 2671-2458 PT Time Calculation (min) (ACUTE ONLY): 17 min  Charges:  $Gait Training: 8-22 mins                     Rylan Bernard P., PTA Acute Rehabilitation Services Secure Chat Preferred 9a-5:30pm Office: [phone]    Angus Palms 09/15/2021, 2:33 PM

## 2021-09-15 NOTE — Progress Notes (Addendum)
Occupational Therapy Treatment Patient Details Name: Scott Grimes MRN: [id number] DOB: 27-Oct-1960 Today's Date: 09/15/2021   History of present illness 61 y.o. male admitted 09/07/21 with witnessed seizure, remained unresponsive, found to have VT then VF by EMS requiring cardioversion. Pt with acute encephalopathy, concern for anoxic vs toxic encephalopathy; UDS (+) cocaine. ETT 6/7-6/8. EEG 6/8 suggestive of moderate to severe diffuse encephalopathy, nonspecific etiology; no seizures noted. PMHx: chronic back pain, polysubstance abuse, stroke, intellectual disability.   OT comments  Patient received in supine and asking to use bathroom. Patient was min guard assist to get to EOB and ambulated to bathroom with RW and min guard to min assist. Patient able to stand at sink for grooming and UB bathing without seated rest break. Patient performed mobility with RW and verbal cues for safety and hand placement. Patient continues to make good progress and will continue to be followed by acute OT.    Recommendations for follow up therapy are one component of a multi-disciplinary discharge planning process, led by the attending physician.  Recommendations may be updated based on patient status, additional functional criteria and insurance authorization.    Follow Up Recommendations   HHOT    Assistance Recommended at Discharge Frequent or constant Supervision/Assistance  Patient can return home with the following  Assistance with cooking/housework;Direct supervision/assist for medications management;Direct supervision/assist for financial management;Assist for transportation;Help with stairs or ramp for entrance;A little help with walking and/or transfers;A little help with bathing/dressing/bathroom   Equipment Recommendations  Tub/shower bench;BSC/3in1    Recommendations for Other Services      Precautions / Restrictions Precautions Precautions: Fall Precaution Comments: Watch SpO2 (pt reports  he does not wear baseline) Restrictions Weight Bearing Restrictions: No       Mobility Bed Mobility Overal bed mobility: Needs Assistance Bed Mobility: Supine to Sit     Supine to sit: Min guard     General bed mobility comments: verbal cues and use of bed rail    Transfers Overall transfer level: Needs assistance Equipment used: Rolling walker (2 wheels) Transfers: Sit to/from Stand Sit to Stand: Min guard, Min assist           General transfer comment: min guard to min assist for transfers with cues for safety and hand placement     Balance Overall balance assessment: Needs assistance Sitting-balance support: Feet supported Sitting balance-Leahy Scale: Fair     Standing balance support: Single extremity supported, Bilateral upper extremity supported, During functional activity Standing balance-Leahy Scale: Poor Standing balance comment: able to stand at sink for grooming tasks using one extremity                           ADL either performed or assessed with clinical judgement   ADL Overall ADL's : Needs assistance/impaired     Grooming: Wash/dry hands;Wash/dry face;Oral care;Brushing hair;Min guard;Standing Grooming Details (indicate cue type and reason): standing at sink Upper Body Bathing: Min guard;Standing Upper Body Bathing Details (indicate cue type and reason): at sink             Toilet Transfer: Min guard;Minimal assistance;Regular Toilet;Rolling walker (2 wheels) Toilet Transfer Details (indicate cue type and reason): verbal cues for hand placement and safety           General ADL Comments: frequent cues for safety    Extremity/Trunk Assessment              Vision  Perception     Praxis      Cognition Arousal/Alertness: Awake/alert Behavior During Therapy: WFL for tasks assessed/performed Overall Cognitive Status: Within Functional Limits for tasks assessed Area of Impairment: Problem solving,  Awareness                 Orientation Level: Disoriented to, Place, Situation Current Attention Level: Focused   Following Commands: Follows one step commands consistently Safety/Judgement: Decreased awareness of safety, Decreased awareness of deficits Awareness: Intellectual Problem Solving: Slow processing General Comments: frequent cues for safety        Exercises      Shoulder Instructions       General Comments      Pertinent Vitals/ Pain       Pain Assessment Pain Assessment: Faces Faces Pain Scale: Hurts a little bit Pain Location: general stiffness Pain Descriptors / Indicators: Guarding Pain Intervention(s): Monitored during session  Home Living                                          Prior Functioning/Environment              Frequency  Min 2X/week        Progress Toward Goals  OT Goals(current goals can now be found in the care plan section)  Progress towards OT goals: Progressing toward goals  Acute Rehab OT Goals Patient Stated Goal: get better OT Goal Formulation: With patient Time For Goal Achievement: 09/25/21 Potential to Achieve Goals: Good ADL Goals Pt Will Perform Lower Body Bathing: with modified independence;sit to/from stand;sitting/lateral leans Pt Will Perform Lower Body Dressing: with modified independence;sitting/lateral leans;sit to/from stand Pt Will Transfer to Toilet: with modified independence;ambulating Pt Will Perform Toileting - Clothing Manipulation and hygiene: with modified independence;sitting/lateral leans;sit to/from stand Additional ADL Goal #1: Pt will complete a medicaiton managment with no errors.  Plan Discharge plan remains appropriate    Co-evaluation                 AM-PAC OT "6 Clicks" Daily Activity     Outcome Measure   Help from another person eating meals?: A Little Help from another person taking care of personal grooming?: A Little Help from another person  toileting, which includes using toliet, bedpan, or urinal?: A Little Help from another person bathing (including washing, rinsing, drying)?: A Little Help from another person to put on and taking off regular upper body clothing?: A Little Help from another person to put on and taking off regular lower body clothing?: A Little 6 Click Score: 18    End of Session Equipment Utilized During Treatment: Gait belt;Rolling walker (2 wheels)  OT Visit Diagnosis: Unsteadiness on feet (R26.81);Other abnormalities of gait and mobility (R26.89);Muscle weakness (generalized) (M62.81)   Activity Tolerance Patient tolerated treatment well   Patient Left in chair;with call bell/phone within reach;with chair alarm set   Nurse Communication Mobility status        Time: 0947-0962 OT Time Calculation (min): 24 min  Charges: OT General Charges $OT Visit: 1 Visit OT Treatments $Self Care/Home Management : 23-37 mins  Alfonse Flavors, OTA Acute Rehabilitation Services  Pager [phone] Office [phone]   Dewain Penning 09/15/2021, 11:01 AM

## 2021-09-15 NOTE — Progress Notes (Addendum)
Inpatient Rehab Admissions Coordinator:   Awaiting determination from [facility] Medicare regarding CIR prior auth request.  Will continue to follow.   1115: updated pt at bedside.   1:36: Fransico Him offering opportunity for peer to peer.  Notified Dr. Natale Milch.  Note d/c orders in and ?plan home with [facility] follow up.    Estill Dooms, PT, DPT Admissions Coordinator [phone] 09/15/21  10:52 AM

## 2021-09-15 NOTE — TOC Transition Note (Addendum)
Transition of Care [facility]) - CM/SW Discharge Note   Patient Details  Name: Scott Grimes MRN: [id number] Date of Birth: 1960-06-18  Transition of Care [facility]) CM/SW Contact:  Curlene Labrum, RN Phone Number: 09/15/2021, 3:42 PM   Clinical Narrative:    CM met with the patient at the bedside and the patient will be discharging home with his friend, Scott Grimes today.  He will have 24 hours supervision at the home along with care assistance from her husband as well.  The patient states he has a rolling walker at the house and will only need a tub bench transfer seat.  I called [employer] and ordered a tub bench transfer seat to be deliver to the hospital room prior to discharge home.  I called numerous home health companies and was unable to provide home health for the patient - except for a possible pending decision from [place] [facility].  Attending physician, Dr. Avon Gully is aware and outpatient therapies were ordered as a back up plan.  Medications - Medications will be provided through [facility] in [place].  I spoke with the patient's friend Scott Grimes and she is unable to transport him home until tomorrow - Attending physician will be notified.  PCP - follow up is scheduled for [facility].  Outpatient counseling placed in the instructions for substance abuse counseling as well.  CM and MSW with DTP team will continue to follow the patient for discharge home tomorrow at 12 noon when the family can pick him up by car.   Final next level of care: [place] Barriers to Discharge: No Barriers Identified   Patient Goals and CMS Choice Patient states their goals for this hospitalization and ongoing recovery are:: to go home CMS Medicare.gov Compare Post Acute Care list provided to:: Patient Choice offered to / list presented to : Patient  Discharge Placement                       Discharge Plan and Services In-house Referral: Clinical  Social Work, PCP / Psychologist, educational Discharge Planning Services: CM Consult Post Acute Care Choice: Home Health          DME Arranged: Tub bench DME Agency: [employer] Date DME Agency Contacted: 09/15/21 Time DME Agency Contacted: [phone] Representative spoke with at DME Agency: Jeanella Anton, [place] with [employer] to deliver equipment to the hospital room            Social Determinants of Health (SDOH) Interventions     Readmission Risk Interventions     No data to display

## 2021-09-15 NOTE — Discharge Summary (Signed)
Physician Discharge Summary  Scott Grimes [id number] DOB: 11/03/1960 DOA: 09/07/2021  PCP: Scott Grimes, No  Admit date: 09/07/2021 Discharge date: 09/15/2021  Admitted From: Home Disposition: Home  Recommendations for Outpatient Follow-up:  Follow up with PCP in 1-2 weeks  Home Health: Home health PT OT versus outpatient physical therapy pending approval Equipment/Devices: No new equipment  Discharge Condition: Stable CODE STATUS: Full Diet recommendation: As tolerated  Brief/Interim Summary: Scott Grimes is a 61 y.o. male with a history of cocaine use, intellectual disability who presented by EMS to the ED on 6/7 after sustaining VT, then VF cardiac arrest after a witnessed seizure with ROSC after CPR and cardioversion. Lidocaine started by EMS and patient intubated in ED. Ultimately extubated and off pressors 6/8, continued on unasyn for aspiration pneumonia. Troponin was elevated, and heparin IV was given for 48 hours. Cardiology consulted and with improved LVEF, recommend no further inpatient investigations.    Clinically improving - no further inpatient workup per discussion with cardiology.  Patient had a prolonged hospitalization due to his unsafe discharge locations as he currently lives alone at home with intellectual delay and profound ambulatory dysfunction initially but now improving.  Further discussion with PT OT as he has been waiting for inpatient rehab approval which was unfortunately denied today he is able to ambulate with minimal assist, OT recommending care at home given his mental status which is certainly reasonable.  Patient thankfully has friend Scott Grimes as well as family members are willing to take care of him at home.  Patient is otherwise medically stable for discharge, lengthy discussion about need to limit patient's access to narcotic medications and illicit substances given his mental status he may not fully comprehend the risk factors involved.  Patient otherwise  stable and agreeable for discharge home to continue therapy, care with family and friends and close follow-up with PCP in the near future.  Discharge Diagnoses:  Principal Problem:   Cardiac arrest [facility]) Active Problems:   Protein-calorie malnutrition, severe   Demand ischemia ([place])   Toxic encephalopathy   Cocaine use with intoxication with complication [facility])  Ventricular tachycardia with out of hospital cardiac arrest:  - Due to cocaine use.  - No further management recommended aside from cocaine cessation -signing off, appreciate their assistance in this case.   Acute systolic CHF:  - Improved by serial echocardiograms.  - No further work up or treatment recommended by cardiology.    Demand myocardial ischemia, type 2 NSTEMI:  - Repeat echo showed improvement w/LVEF ~60-65% with basal inferior hypokinesis.  No further work-up, continue aspirin and statin at discharge   Acute toxic encephalopathy, resolved:  -Chronic intellectual delay noted, currently at baseline   Cocaine use and intoxication:  - +UDS, ? chronic use.  Cessation recommended   Profound ambulatory dysfunction, multifactorial in the setting of above, resolved - Unclear baseline ambulation at home but patient states he uses a walker or cane only as needed and not consistently.  -Continue PT OT outpatient   Hypophosphatemia:  - Resolved with supplementation   Hypoalbuminemia:  - Improve nutritional status.   Acute hypoxic respiratory failure:  -Resolved secondary to above LFT elevation: - Resolved AKI: - Resolved Severe protein calorie malnutrition: - Supplement protein as able Tobacco use: - Cessation counseling  Discharge Instructions  Discharge Instructions     Ambulatory referral to Physical Therapy   Complete by: As directed    Iontophoresis - 4 mg/ml of dexamethasone: No   T.E.N.S. Unit Evaluation and Dispense  as Indicated: No   Discharge patient   Complete by: As directed    Discharge  disposition: 06-Home-Health Care Svc   Discharge patient date: 09/15/2021   Face-to-face encounter (required for Medicare/Medicaid patients)   Complete by: As directed    [place] certify that this patient is under my care and that I, or a nurse practitioner or physician's assistant working with me, had a face-to-face encounter that meets the physician face-to-face encounter requirements with this patient on 09/15/2021. The encounter with the patient was in whole, or in part for the following medical condition(s) which is the primary reason for home health care (List medical condition): Ambulatory dysfunction   The encounter with the patient was in whole, or in part, for the following medical condition, which is the primary reason for home health care: Ambulatory dysfunction   I certify that, based on my findings, the following services are medically necessary home health services: Physical therapy   Reason for Medically Necessary Home Health Services: Therapy- Personnel officer, Public librarian   My clinical findings support the need for the above services: Unable to leave home safely without assistance and/or assistive device   Further, I certify that my clinical findings support that this patient is homebound due to: Unable to leave home safely without assistance   Home Health   Complete by: As directed    To provide the following care/treatments:  PT [place] work        Allergies as of 09/15/2021   No Known Allergies      Medication List     TAKE these medications    albuterol 108 (90 Base) MCG/ACT inhaler Commonly known as: VENTOLIN HFA Inhale 4 puffs into the lungs every 4 (four) hours as needed for shortness of breath.   aspirin 81 MG chewable tablet Chew 1 tablet (81 mg total) by mouth daily. Start taking on: September 16, 2021   feeding supplement Liqd Take 237 mLs by mouth 3 (three) times daily between meals.   folic acid 1  MG tablet Commonly known as: FOLVITE Take 1 tablet (1 mg total) by mouth daily. Start taking on: September 16, 2021   multivitamin with minerals Tabs tablet Take 1 tablet by mouth daily. Start taking on: September 16, 2021   rosuvastatin 40 MG tablet Commonly known as: CRESTOR Take 1 tablet (40 mg total) by mouth daily. Start taking on: September 16, 2021   thiamine 100 MG tablet Take 1 tablet (100 mg total) by mouth daily. Start taking on: September 16, 2021               Durable Medical Equipment  (From admission, onward)           Start     Ordered   09/15/21 1506  For home use only DME Tub bench  Once        09/15/21 1505            Follow-up Information     [place] Follow up.   Contact information: [place] [phone]        [facility]. Call.   Specialty: Rehabilitation Why: Please call to schedule Outpatient therapies at the above number. Contact information: [place] [id number] [place] [phone]               No Known Allergies  Consultations: ICU, cardiology  Procedures/Studies: ECHOCARDIOGRAM LIMITED  Result Date: 09/10/2021    ECHOCARDIOGRAM LIMITED REPORT   Patient Name:   Scott Grimes Date of Exam: 09/10/2021 Medical Rec #:  [id number]        Height:       71.0 in Accession #:    [id number]       Weight:       163.1 lb Date of Birth:  July 04, 1960        BSA:          1.933 m Patient Age:    74 years         BP:           118/78 mmHg Patient Gender: M                HR:           78 bpm. Exam Location:  Inpatient Procedure: Limited Echo Indications:    Elevated troponin  History:        Patient has prior history of Echocardiogram examinations, most                 recent 09/08/2021. Stroke, Arrythmias:Cardiac Arrest; Risk                 Factors:Current Smoker. Cocaine induced cardiac  arrest. 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  Sonographer:    Merrie Roof RDCS Referring Phys: [id number] [place]  1. Limited study to assess LV function; full images not obtained. Basal inferior hypokinesis with overall preserved LV function.  2. Left ventricular ejection fraction, by estimation, is 60 to 65%. The left ventricle has normal function. The left ventricle demonstrates regional wall motion abnormalities (see scoring diagram/findings for description).  3. Right ventricular systolic function is mildly reduced. The right ventricular size is mildly enlarged.  4. The mitral valve is normal in structure. Mild mitral valve regurgitation.  5. The inferior vena cava is dilated in size with >50% respiratory variability, suggesting right atrial pressure of 8 mmHg. FINDINGS  Left Ventricle: Left ventricular ejection fraction, by estimation, is 60 to 65%. The left ventricle has normal function. The left ventricle demonstrates regional wall motion abnormalities. The left ventricular internal cavity size was normal in size. Right Ventricle: The right ventricular size is mildly enlarged. Right ventricular systolic function is mildly reduced. The tricuspid regurgitant velocity is 2.45 m/s, and with an assumed right atrial pressure of 8 mmHg, the estimated right ventricular systolic pressure is [id number]. Left Atrium: Left atrial size was normal in size. Right Atrium: Right atrial size was normal in size. Pericardium: There is no evidence of pericardial effusion. Mitral Valve: The mitral valve is normal in structure. Mild mitral valve regurgitation. Tricuspid Valve: The tricuspid valve is normal in structure. Tricuspid valve regurgitation is mild. Aorta: The aortic root is normal in size and structure. Venous: The inferior vena cava is dilated in size with greater than 50% respiratory variability, suggesting right atrial pressure of 8 mmHg. Additional  Comments: Limited study to assess LV function; full images not obtained. Basal inferior hypokinesis with overall preserved LV function. RIGHT VENTRICLE          IVC RV Basal diam:  4.00 cm  IVC diam: 2.30 cm RV Mid diam:    2.70 cm TRICUSPID VALVE TR Peak grad:   24.0 mmHg  TR Vmax:        245.00 cm/s Kirk Ruths MD Electronically signed by Kirk Ruths MD Signature Date/Time: 09/10/2021/12:36:49 PM    Final    DG Swallowing Func-Speech Pathology  Result Date: 09/09/2021 Table formatting from the original result was not included. Images from the original result were not included. Objective Swallowing Evaluation: Type of Study: MBS-Modified Barium Swallow Study  Patient Details Name: Scott Grimes MRN: [id number] Date of Birth: [birth year] Today's Date: 09/09/2021 Time: SLP Start Time (ACUTE ONLY): 1400 -SLP Stop Time (ACUTE ONLY): 1420 SLP Time Calculation (min) (ACUTE ONLY): 20 min Past Medical History: Past Medical History: Diagnosis Date  COVID-19 02/2021  Low back pain   Stroke [facility])  Past Surgical History: No past surgical history on file. HPI: Pt is a 61 year old male admitted with Cardiac arrest- VT, VF- likley due to acute drug intoxication. Likely NSTEMI likely from demand. Acute encephalopathy, concern for anoxic vs toxic encephalopathy. Sister mentions baseline cognitive impairment and speech difficulties. Intubated for one day 6/7- 6/8.  No data recorded  Recommendations for follow up therapy are one component of a multi-disciplinary discharge planning process, led by the attending physician.  Recommendations may be updated based on patient status, additional functional criteria and insurance authorization. Assessment / Plan / Recommendation   09/09/2021   2:00 PM Clinical Impressions Clinical Impression Pt demonstrates restlessness and poor positioning by the end of MBS. Initially pt was able to sip from a straw upright with thin liquids with no impairment. Pt slid himself to a more horizontal  position, and liquids arrived deeper into the pharynx prior to swallowing with sensed aspiraiton (PAS7). Coughing elicited regurgitation from cervical esophagus. Transitioned to nectar thick liquids with success. Pt tolerated solids well despite missing dentition. Recommend nectar thick liquids and mechanical soft solids. Will f/u for tolerance. SLP Visit Diagnosis Dysphagia, oropharyngeal phase (R13.12) Impact on safety and function Mild aspiration risk     09/09/2021   2:00 PM Treatment Recommendations Treatment Recommendations Therapy as outlined in treatment plan below      No data to display      09/09/2021   2:00 PM Diet Recommendations SLP Diet Recommendations Nectar thick liquid;Dysphagia 3 (Mech soft) solids Liquid Administration via Cup;Straw Medication Administration Whole meds with liquid Compensations Slow rate;Small sips/bites     09/09/2021   2:00 PM Other Recommendations Oral Care Recommendations Oral care BID    No data to display        09/09/2021   2:00 PM Oral Phase Oral Phase Impaired Oral - Nectar Straw WFL Oral - Thin Straw WFL Oral - Puree WFL Oral - Mech Soft [facility]    09/09/2021   2:00 PM Pharyngeal Phase Pharyngeal Phase Impaired Pharyngeal- Nectar Straw Delayed swallow initiation-pyriform sinuses Pharyngeal- Thin Cup Delayed swallow initiation-pyriform sinuses Pharyngeal- Thin Straw Delayed swallow initiation-vallecula;Delayed swallow initiation-pyriform sinuses;Penetration/Aspiration before swallow Pharyngeal Material enters airway, passes BELOW cords and not ejected out despite cough attempt by patient;Material does not enter airway Pharyngeal- Puree WFL Pharyngeal- Regular WFL     No data to display    DeBlois, Katherene Ponto 09/09/2021, 2:38 PM                     CT Angio Chest Pulmonary Embolism (PE) W or WO Contrast  Result Date: 09/08/2021 CLINICAL DATA:  Concern for PE on echo. EXAM: CT ANGIOGRAPHY CHEST WITH CONTRAST TECHNIQUE: Multidetector CT imaging of the chest was performed using the  standard protocol during  bolus administration of intravenous contrast. Multiplanar CT image reconstructions and MIPs were obtained to evaluate the vascular anatomy. RADIATION DOSE REDUCTION: This exam was performed according to the departmental dose-optimization program which includes automated exposure control, adjustment of the mA and/or kV according to patient size and/or use of iterative reconstruction technique. CONTRAST:  170mL OMNIPAQUE IOHEXOL 350 MG/ML SOLN COMPARISON:  None Available. FINDINGS: Cardiovascular: Satisfactory opacification of the pulmonary arteries to the segmental level. No evidence of pulmonary embolism. Normal heart size. No pericardial effusion. There are atherosclerotic calcifications of the coronary arteries. Mediastinum/Nodes: There is some mildly hyperdense fluid and stranding in the anterior mediastinum at the level of the heart. There are no enlarged mediastinal or hilar lymph nodes. The visualized thyroid gland is within normal limits. Small hiatal hernia is present. Lungs/Pleura: Severe emphysematous changes are present. There is diffuse peribronchial wall thickening, most significant in the right lower lobe. Trachea and central airways are patent. There is some patchy airspace disease and ground-glass opacity in the bilateral lung bases/lower lobes. There are trace bilateral pleural effusions. Upper Abdomen: No acute abnormality. Musculoskeletal: No chest wall abnormality. No acute or significant osseous findings. Review of the MIP images confirms the above findings. IMPRESSION: 1. No evidence for pulmonary embolism. 2. Small amount of fluid and stranding in the anterior mediastinum of uncertain etiology. Please correlate clinically for infection or resolving anterior mediastinal hematoma. 3. Diffuse peribronchial wall thickening may be infectious/inflammatory or related to edema. 4. Minimal airspace and ground-glass opacities in the bilateral lower lobes worrisome for infection.  5. Trace bilateral pleural effusions. 6. Severe emphysema. Emphysema (ICD10-J43.9). Electronically Signed   By: Ronney Asters M.D.   On: 09/08/2021 21:34   ECHOCARDIOGRAM COMPLETE  Result Date: 09/08/2021    ECHOCARDIOGRAM REPORT   Patient Name:   Scott Grimes Date of Exam: 09/08/2021 Medical Rec #:  [id number]        Height:       71.0 in Accession #:    [id number]       Weight:       162.0 lb Date of Birth:  Sep 01, 1960        BSA:          1.928 m Patient Age:    70 years         BP:           101/78 mmHg Patient Gender: M                HR:           74 bpm. Exam Location:  Inpatient Procedure: 2D Echo, Cardiac Doppler and Color Doppler Indications:   Cardiac arrest  History:       Patient has no prior history of Echocardiogram examinations.                Stroke.  Sonographer:   [place] Referring      [phone] Julian Hy Phys:  Sonographer Comments: Technically challenging study due to limited acoustic windows and echo performed with patient supine and on artificial respirator. IMPRESSIONS  1. Anteroseptal and inferoseptal hypokinesis. Left ventricular ejection fraction, by estimation, is 40 to 45%. The left ventricle has mildly decreased function. The left ventricle demonstrates regional wall motion abnormalities (see scoring diagram/findings for description). Left ventricular diastolic parameters are indeterminate. There is the interventricular septum is flattened in systole and diastole, consistent with right ventricular pressure and volume overload.  2. McConnell's sign. Severe RV hypokinesis with sparing of  the apex. . Right ventricular systolic function is severely reduced. The right ventricular size is mildly enlarged. There is normal pulmonary artery systolic pressure.  3. Left atrial size was moderately dilated.  4. Right atrial size was moderately dilated.  5. The mitral valve is normal in structure. No evidence of mitral valve regurgitation. No evidence of mitral stenosis.  6. The  aortic valve is tricuspid. Aortic valve regurgitation is not visualized. No aortic stenosis is present.  7. The inferior vena cava is dilated in size with <50% respiratory variability, suggesting right atrial pressure of 15 mmHg. FINDINGS  Left Ventricle: Anteroseptal and inferoseptal hypokinesis. Left ventricular ejection fraction, by estimation, is 40 to 45%. The left ventricle has mildly decreased function. The left ventricle demonstrates regional wall motion abnormalities. The left ventricular internal cavity size was normal in size. There is no left ventricular hypertrophy. The interventricular septum is flattened in systole and diastole, consistent with right ventricular pressure and volume overload. Left ventricular diastolic parameters are indeterminate. Right Ventricle: McConnell's sign. Severe RV hypokinesis with sparing of the apex. The right ventricular size is mildly enlarged. No increase in right ventricular wall thickness. Right ventricular systolic function is severely reduced. There is normal pulmonary artery systolic pressure. The tricuspid regurgitant velocity is 2.13 m/s, and with an assumed right atrial pressure of 15 mmHg, the estimated right ventricular systolic pressure is [id number] mmHg. Left Atrium: Left atrial size was moderately dilated. Right Atrium: Right atrial size was moderately dilated. Prominent Eustachian valve. Pericardium: Trivial pericardial effusion is present. Mitral Valve: The mitral valve is normal in structure. No evidence of mitral valve regurgitation. No evidence of mitral valve stenosis. Tricuspid Valve: The tricuspid valve is normal in structure. Tricuspid valve regurgitation is mild . No evidence of tricuspid stenosis. Aortic Valve: The aortic valve is tricuspid. Aortic valve regurgitation is not visualized. No aortic stenosis is present. Aortic valve mean gradient measures 3.0 mmHg. Aortic valve peak gradient measures 6.7 mmHg. Aortic valve area, by VTI measures 4.07 cm.  Pulmonic Valve: The pulmonic valve was normal in structure. Pulmonic valve regurgitation is not visualized. No evidence of pulmonic stenosis. Aorta: The aortic root is normal in size and structure. Venous: The inferior vena cava is dilated in size with less than 50% respiratory variability, suggesting right atrial pressure of 15 mmHg. IAS/Shunts: No atrial level shunt detected by color flow Doppler. Additional Comments: McConnell's sign. Severe RV hypokinesis with sparing of the apex. Consider pulmonary embolism.  LEFT VENTRICLE PLAX 2D LVIDd:         4.30 cm     Diastology LVIDs:         3.50 cm     LV e' medial:  9.48 cm/s LV PW:         1.00 cm     LV e' lateral: 17.30 cm/s LV IVS:        0.90 cm LVOT diam:     2.40 cm LV SV:         93 LV SV Index:   48 LVOT Area:     4.52 cm  LV Volumes (MOD) LV vol d, MOD A4C: 61.2 ml LV vol s, MOD A4C: 42.1 ml LV SV MOD A4C:     61.2 ml RIGHT VENTRICLE RV Basal diam:  4.10 cm RV Mid diam:    4.20 cm RV S prime:     13.90 cm/s TAPSE (M-mode): 1.9 cm LEFT ATRIUM  Index        RIGHT ATRIUM           Index LA Vol (A2C):   69.5 ml 36.05 ml/m  RA Area:     17.50 cm LA Vol (A4C):   71.5 ml 37.08 ml/m  RA Volume:   47.30 ml  24.53 ml/m LA Biplane Vol: 72.8 ml 37.76 ml/m  AORTIC VALVE                    PULMONIC VALVE AV Area (Vmax):    3.86 cm     PV Vmax:       0.88 m/s AV Area (Vmean):   3.68 cm     PV Vmean:      60.750 cm/s AV Area (VTI):     4.07 cm     PV VTI:        0.194 m AV Vmax:           129.00 cm/s  PV Peak grad:  3.1 mmHg AV Vmean:          83.400 cm/s  PV Mean grad:  1.5 mmHg AV VTI:            0.228 m AV Peak Grad:      6.7 mmHg AV Mean Grad:      3.0 mmHg LVOT Vmax:         110.00 cm/s LVOT Vmean:        67.900 cm/s LVOT VTI:          0.205 m LVOT/AV VTI ratio: 0.90  AORTA Ao Root diam: 3.40 cm TRICUSPID VALVE TR Peak grad:   18.1 mmHg TR Vmax:        213.00 cm/s  SHUNTS Systemic VTI:  0.20 m Systemic Diam: 2.40 cm Skeet Latch MD  Electronically signed by Skeet Latch MD Signature Date/Time: 09/08/2021/3:40:58 PM    Final    EEG adult  Result Date: 09/08/2021 Lora Havens, MD     09/08/2021  9:20 AM Patient Name: Scott Grimes MRN: [id number] Epilepsy Attending: Lora Havens Referring Physician/Provider: Corey Harold, NP Date: 09/08/2021 Duration: 22.18 mins Patient history: 61 year old male status post cardiac arrest.  EEG to evaluate for seizure. Level of alertness: Lethargic AEDs during EEG study: LEV, propofol Technical aspects: This EEG study was done with scalp electrodes positioned according to the 10-20 International system of electrode placement. Electrical activity was acquired at a sampling rate of 500Hz  and reviewed with a high frequency filter of 70Hz  and a low frequency filter of 1Hz . EEG data were recorded continuously and digitally stored. Description: EEG showed continuous generalized 5 to 7 Hz theta slowing admixed with intermittent generalized 2-3Hz  delta slowing. Hyperventilation and photic stimulation were not performed.   ABNORMALITY - Continuous slow, generalized IMPRESSION: This study is suggestive of moderate to severe diffuse encephalopathy, nonspecific etiology. No seizures or epileptiform discharges were seen throughout the recording. Lora Havens   DG Abd Portable 1 View  Result Date: 09/08/2021 CLINICAL DATA:  OG tube placement. EXAM: PORTABLE ABDOMEN - 1 VIEW COMPARISON:  None Available. FINDINGS: Paucity of gas in the small and large bowel. Small amount of air is noted in the rectum. An enteric tube terminates in the stomach. No radio-opaque calculi or other significant radiographic abnormality are seen. IMPRESSION: 1. Paucity of bowel gas in the small and large bowel and small amount of gas in the rectum. Clinical correlation is recommended. 2. The enteric tube terminates in  the stomach. Electronically Signed   By: Brett Fairy M.D.   On: 09/08/2021 00:33   DG Chest Portable 1  View  Result Date: 09/08/2021 CLINICAL DATA:  Endotracheal tube placement. EXAM: PORTABLE CHEST 1 VIEW COMPARISON:  09/07/2021. FINDINGS: The heart size and mediastinal contours are within normal limits. The visualized lungs are clear. In the right lateral lung is excluded from the field of view. No definite effusion or pneumothorax. No acute osseous abnormality. The endotracheal tube terminates 5.0 cm above the carina. An enteric tube terminates in the stomach. IMPRESSION: 1. No acute abnormality. 2. Medical devices as described above. Electronically Signed   By: Brett Fairy M.D.   On: 09/08/2021 00:29   CT Head Wo Contrast  Result Date: 09/07/2021 CLINICAL DATA:  Seizure disorder, clinical change.  Post CPR EXAM: CT HEAD WITHOUT CONTRAST TECHNIQUE: Contiguous axial images were obtained from the base of the skull through the vertex without intravenous contrast. RADIATION DOSE REDUCTION: This exam was performed according to the departmental dose-optimization program which includes automated exposure control, adjustment of the mA and/or kV according to patient size and/or use of iterative reconstruction technique. COMPARISON:  CT head 01/30/2021 BRAIN: BRAIN Brain: Patchy and confluent areas of decreased attenuation are noted throughout the deep and periventricular white matter of the cerebral hemispheres bilaterally, compatible with chronic microvascular ischemic disease. No evidence of large-territorial acute infarction. No parenchymal hemorrhage. No mass lesion. No extra-axial collection. No mass effect or midline shift. No hydrocephalus. Basilar cisterns are patent. Vascular: No hyperdense vessel. Skull: No acute fracture or focal lesion. Sinuses/Orbits: Mucosal thickening of the bilateral maxillary, sphenoid, ethmoid sinuses. Paranasal sinuses and mastoid air cells are clear. The orbits are unremarkable. Other: Frothy secretion within the nasal cavities and nasopharynx. IMPRESSION: 1. No acute intracranial  abnormality. 2. Frothy secretion within the nasal cavities and nasopharynx. Electronically Signed   By: Iven Finn M.D.   On: 09/07/2021 23:38   DG Chest Portable 1 View  Result Date: 09/07/2021 CLINICAL DATA:  Post CPR. EXAM: PORTABLE CHEST 1 VIEW COMPARISON:  Chest x-ray 01/30/2021 FINDINGS: Endotracheal tube tip is 6 cm above the carina. Enteric tube extends below the diaphragm. Lungs are clear. There is no pleural effusion or pneumothorax. No acute fractures are seen. Cardiomediastinal silhouette is within normal limits. IMPRESSION: 1. Endotracheal tube tip 6 cm above carina. 2. Enteric tube extends below the diaphragm. 3. The lungs are clear. Electronically Signed   By: Ronney Asters M.D.   On: 09/07/2021 23:13     Subjective: No acute issues or events overnight   Discharge Exam: Vitals:   09/15/21 0855 09/15/21 1411  BP: (!) 94/59 101/68  Pulse: 64 (!) 54  Resp: 18 16  Temp: 97.8 F (36.6 C) 98.4 F (36.9 C)  SpO2: 98% 99%   Vitals:   09/14/21 1309 09/14/21 2007 09/15/21 0855 09/15/21 1411  BP: 101/61 99/72 (!) 94/59 101/68  Pulse: (!) 52 64 64 (!) 54  Resp: 15 16 18 16   Temp: 97.7 F (36.5 C) 98.5 F (36.9 C) 97.8 F (36.6 C) 98.4 F (36.9 C)  TempSrc: Oral Oral Oral Oral  SpO2: 97% 95% 98% 99%  Weight:      Height:        General: Pt is alert, awake, not in acute distress Cardiovascular: RRR, S1/S2 +, no rubs, no gallops Respiratory: CTA bilaterally, no wheezing, no rhonchi Abdominal: Soft, NT, ND, bowel sounds + Extremities: no edema, no cyanosis    The results of  significant diagnostics from this hospitalization (including imaging, microbiology, ancillary and laboratory) are listed below for reference.     Microbiology: Recent Results (from the past 240 hour(s))  Resp Panel by RT-PCR (Flu A&B, Covid) Anterior Nasal Swab     Status: None   Collection Time: 09/07/21 11:19 PM   Specimen: Anterior Nasal Swab  Result Value Ref Range Status   SARS  Coronavirus 2 by RT PCR NEGATIVE NEGATIVE Final    Comment: (NOTE) SARS-CoV-2 target nucleic acids are NOT DETECTED.  The SARS-CoV-2 RNA is generally detectable in upper respiratory specimens during the acute phase of infection. The lowest concentration of SARS-CoV-2 viral copies this assay can detect is 138 copies/mL. A negative result does not preclude SARS-Cov-2 infection and should not be used as the sole basis for treatment or other patient management decisions. A negative result may occur with  improper specimen collection/handling, submission of specimen other than nasopharyngeal swab, presence of viral mutation(s) within the areas targeted by this assay, and inadequate number of viral copies(<138 copies/mL). A negative result must be combined with clinical observations, patient history, and epidemiological information. The expected result is Negative.  Fact Sheet for Patients:  BloggerCourse.com  Fact Sheet for Healthcare Providers:  SeriousBroker.it  This test is no t yet approved or cleared by the [place] FDA and  has been authorized for detection and/or diagnosis of SARS-CoV-2 by FDA under an Emergency Use Authorization (EUA). This EUA will remain  in effect (meaning this test can be used) for the duration of the COVID-19 declaration under Section 564(b)(1) of the Act, 21 U.S.C.section 360bbb-3(b)(1), unless the authorization is terminated  or revoked sooner.       Influenza A by PCR NEGATIVE NEGATIVE Final   Influenza B by PCR NEGATIVE NEGATIVE Final    Comment: (NOTE) The Xpert Xpress SARS-CoV-2/FLU/RSV plus assay is intended as an aid in the diagnosis of influenza from Nasopharyngeal swab specimens and should not be used as a sole basis for treatment. Nasal washings and aspirates are unacceptable for Xpert Xpress SARS-CoV-2/FLU/RSV testing.  Fact Sheet for  Patients: BloggerCourse.com  Fact Sheet for Healthcare Providers: SeriousBroker.it  This test is not yet approved or cleared by the [place] FDA and has been authorized for detection and/or diagnosis of SARS-CoV-2 by FDA under an Emergency Use Authorization (EUA). This EUA will remain in effect (meaning this test can be used) for the duration of the COVID-19 declaration under Section 564(b)(1) of the Act, 21 U.S.C. section 360bbb-3(b)(1), unless the authorization is terminated or revoked.  Performed at [facility], [address] [place], [place] [address]   Culture, blood (routine x 2)     Status: None   Collection Time: 09/07/21 11:25 PM   Specimen: BLOOD  Result Value Ref Range Status   Specimen Description BLOOD RIGHT ANTECUBITAL  Final   Special Requests   Final    BOTTLES DRAWN AEROBIC AND ANAEROBIC Blood Culture results may not be optimal due to an excessive volume of blood received in culture bottles   Culture   Final    NO GROWTH 5 DAYS Performed at [facility], [address], [place], [place] [address]    Report Status 09/13/2021 FINAL  Final  Culture, blood (routine x 2)     Status: None   Collection Time: 09/07/21 11:25 PM   Specimen: BLOOD LEFT HAND  Result Value Ref Range Status   Specimen Description BLOOD LEFT HAND  Final   Special Requests  AEROBIC BOTTLE ONLY Blood Culture adequate volume  Final   Culture   Final    NO GROWTH 5 DAYS Performed at [facility], [place] [address], [place], [place] [address]    Report Status 09/13/2021 FINAL  Final  MRSA Next Gen by PCR, Nasal     Status: None   Collection Time: 09/08/21  5:47 AM   Specimen: Nasal Mucosa; Nasal Swab  Result Value Ref Range Status   MRSA by PCR Next Gen NOT DETECTED NOT DETECTED Final    Comment: (NOTE) The GeneXpert MRSA Assay (FDA approved for NASAL specimens only), is one component of a comprehensive MRSA  colonization surveillance program. It is not intended to diagnose MRSA infection nor to guide or monitor treatment for MRSA infections. Test performance is not FDA approved in patients less than 77 years old. Performed at [facility], [place] [address], [place], [place] [address]      Labs: BNP (last 3 results) No results for input(s): "BNP" in the last 8760 hours. Basic Metabolic Panel: Recent Labs  Lab 09/09/21 0141 09/10/21 0139 09/11/21 0039 09/12/21 0147  NA 139 137 138 140  K 4.7 3.5 3.8 3.8  CL 109 106 107 111  CO2 26 28 24 22   GLUCOSE 94 93 104* 92  BUN 10 7 8 9   CREATININE 0.94 1.04 1.08 1.19  CALCIUM 8.2* 8.2* 8.1* 8.6*  MG 2.3 1.9 1.8 2.0  PHOS 2.6 1.3* 4.0 3.5   Liver Function Tests: Recent Labs  Lab 09/09/21 0141 09/10/21 0139  AST 44* 28  ALT 40 28  ALKPHOS 98 81  BILITOT 0.5 0.4  PROT 5.6* 5.3*  ALBUMIN 2.9* 2.7*   No results for input(s): "LIPASE", "AMYLASE" in the last 168 hours. No results for input(s): "AMMONIA" in the last 168 hours. CBC: Recent Labs  Lab 09/09/21 0141 09/10/21 0139 09/11/21 0039 09/12/21 0147  WBC 10.4 7.8 7.0 6.8  HGB 12.6* 11.2* 11.9* 12.8*  HCT 37.9* 34.1* 35.5* 37.5*  MCV 95.7 95.3 94.2 93.3  PLT 251 256 275 288   Cardiac Enzymes: No results for input(s): "CKTOTAL", "CKMB", "CKMBINDEX", "TROPONINI" in the last 168 hours. BNP: Invalid input(s): "POCBNP" CBG: Recent Labs  Lab 09/14/21 1648 09/14/21 2158 09/15/21 0742 09/15/21 1119 09/15/21 1609  GLUCAP 120* 112* 86 75 104*   D-Dimer No results for input(s): "DDIMER" in the last 72 hours. Hgb A1c No results for input(s): "HGBA1C" in the last 72 hours. Lipid Profile No results for input(s): "CHOL", "HDL", "LDLCALC", "TRIG", "CHOLHDL", "LDLDIRECT" in the last 72 hours. Thyroid function studies No results for input(s): "TSH", "T4TOTAL", "T3FREE", "THYROIDAB" in the last 72 hours.  Invalid input(s): "FREET3" Anemia work up No results for input(s):  "VITAMINB12", "FOLATE", "FERRITIN", "TIBC", "IRON", "RETICCTPCT" in the last 72 hours. Urinalysis    Component Value Date/Time   COLORURINE YELLOW 09/07/2021 2305   APPEARANCEUR HAZY (A) 09/07/2021 2305   LABSPEC 1.010 09/07/2021 2305   PHURINE 5.0 09/07/2021 2305   GLUCOSEU 50 (A) 09/07/2021 2305   HGBUR SMALL (A) 09/07/2021 2305   BILIRUBINUR NEGATIVE 09/07/2021 2305   KETONESUR NEGATIVE 09/07/2021 2305   PROTEINUR 100 (A) 09/07/2021 2305   NITRITE NEGATIVE 09/07/2021 2305   LEUKOCYTESUR NEGATIVE 09/07/2021 2305   Sepsis Labs Recent Labs  Lab 09/09/21 0141 09/10/21 0139 09/11/21 0039 09/12/21 0147  WBC 10.4 7.8 7.0 6.8   Microbiology Recent Results (from the past 240 hour(s))  Resp Panel by RT-PCR (Flu A&B, Covid) Anterior Nasal  Swab     Status: None   Collection Time: 09/07/21 11:19 PM   Specimen: Anterior Nasal Swab  Result Value Ref Range Status   SARS Coronavirus 2 by RT PCR NEGATIVE NEGATIVE Final    Comment: (NOTE) SARS-CoV-2 target nucleic acids are NOT DETECTED.  The SARS-CoV-2 RNA is generally detectable in upper respiratory specimens during the acute phase of infection. The lowest concentration of SARS-CoV-2 viral copies this assay can detect is 138 copies/mL. A negative result does not preclude SARS-Cov-2 infection and should not be used as the sole basis for treatment or other patient management decisions. A negative result may occur with  improper specimen collection/handling, submission of specimen other than nasopharyngeal swab, presence of viral mutation(s) within the areas targeted by this assay, and inadequate number of viral copies(<138 copies/mL). A negative result must be combined with clinical observations, patient history, and epidemiological information. The expected result is Negative.  Fact Sheet for Patients:  EntrepreneurPulse.com.au  Fact Sheet for Healthcare Providers:   IncredibleEmployment.be  This test is no t yet approved or cleared by the [place] FDA and  has been authorized for detection and/or diagnosis of SARS-CoV-2 by FDA under an Emergency Use Authorization (EUA). This EUA will remain  in effect (meaning this test can be used) for the duration of the COVID-19 declaration under Section 564(b)(1) of the Act, 21 U.S.C.section 360bbb-3(b)(1), unless the authorization is terminated  or revoked sooner.       Influenza A by PCR NEGATIVE NEGATIVE Final   Influenza B by PCR NEGATIVE NEGATIVE Final    Comment: (NOTE) The Xpert Xpress SARS-CoV-2/FLU/RSV plus assay is intended as an aid in the diagnosis of influenza from Nasopharyngeal swab specimens and should not be used as a sole basis for treatment. Nasal washings and aspirates are unacceptable for Xpert Xpress SARS-CoV-2/FLU/RSV testing.  Fact Sheet for Patients: EntrepreneurPulse.com.au  Fact Sheet for Healthcare Providers: IncredibleEmployment.be  This test is not yet approved or cleared by the [place] FDA and has been authorized for detection and/or diagnosis of SARS-CoV-2 by FDA under an Emergency Use Authorization (EUA). This EUA will remain in effect (meaning this test can be used) for the duration of the COVID-19 declaration under Section 564(b)(1) of the Act, 21 U.S.C. section 360bbb-3(b)(1), unless the authorization is terminated or revoked.  Performed at [facility], [place] [address] [place], [place] [address]   Culture, blood (routine x 2)     Status: None   Collection Time: 09/07/21 11:25 PM   Specimen: BLOOD  Result Value Ref Range Status   Specimen Description BLOOD RIGHT ANTECUBITAL  Final   Special Requests   Final    BOTTLES DRAWN AEROBIC AND ANAEROBIC Blood Culture results may not be optimal due to an excessive volume of blood received in culture bottles   Culture   Final    NO GROWTH 5  DAYS Performed at [facility], [place] [address] [place], [place] [address]    Report Status 09/13/2021 FINAL  Final  Culture, blood (routine x 2)     Status: None   Collection Time: 09/07/21 11:25 PM   Specimen: BLOOD LEFT HAND  Result Value Ref Range Status   Specimen Description BLOOD LEFT HAND  Final   Special Requests AEROBIC BOTTLE ONLY Blood Culture adequate volume  Final   Culture   Final    NO GROWTH 5 DAYS Performed at [facility], [place] [address] [place],  [address]  Report Status 09/13/2021 FINAL  Final  MRSA Next Gen by PCR, Nasal     Status: None   Collection Time: 09/08/21  5:47 AM   Specimen: Nasal Mucosa; Nasal Swab  Result Value Ref Range Status   MRSA by PCR Next Gen NOT DETECTED NOT DETECTED Final    Comment: (NOTE) The GeneXpert MRSA Assay (FDA approved for NASAL specimens only), is one component of a comprehensive MRSA colonization surveillance program. It is not intended to diagnose MRSA infection nor to guide or monitor treatment for MRSA infections. Test performance is not FDA approved in patients less than 86 years old. Performed at [facility], [address], [place], [place] [address]      Time coordinating discharge: Over 30 minutes  SIGNED:   Azucena Fallen, DO [facility] 09/15/2021, 5:05 PM Pager   If 7PM-7AM, please contact night-coverage [email or web address]

## 2021-09-16 LAB — GLUCOSE, CAPILLARY
Glucose-Capillary: 92 mg/dL (ref 70–99)
Glucose-Capillary: 110 mg/dL — ABNORMAL HIGH (ref 70–99)

## 2021-09-16 NOTE — Plan of Care (Signed)

## 2021-09-16 NOTE — Progress Notes (Signed)
Discharge paperwork gone over with patient and son Harrold Donath, who will be transporting him home. Son stated he will call friend who said he would pick patient up and tell him he will bring him home. IV's removed, patient and son educated, medications gone over, No further questions. Wheeled to Main entrance.

## 2021-09-16 NOTE — TOC Transition Note (Signed)
Transition of Care [facility]) - CM/SW Discharge Note   Patient Details  Name: Scott Grimes MRN: [id number] Date of Birth: 1960-07-30  [facility] CM/SW Contact:  Curlene Labrum, RN Phone Number: 09/16/2021, 10:04 AM   Clinical Narrative:    CM met with the patient and spoke with the family on 09/15/2021 to coordinate discharge to home at 12 noon 09/16/2021.  I was unable to coordinate home health services since home health staffing was unavailable in the area.  Attending physician, Dr. Avon Gully is aware and Outpatient PT/OT was ordered and referral placed at the [facility] and documented in the discharge instructions. The patient does not have a current PCP and [facility] was called to schedule a hospital follow up appointment and documented in the discharge instructions.  The patient has a rolling walker at home and is in need of a tub/bench transfer seat - I spoke with [employer] and ordered.  [employer] called the patient's family yesterday and tub/bench was declined by the family at this time - since the son states that they have all needed equipment at the home.  Discharge medications were sent to the patient's [facility] pharmacy in [place], [place].  Outpatient follow up information is noted in the discharge instructions for outpatient follow up for substance abuse counseling and care since the patient was admitted with positive cocaine use.  Bedside nursing is to give discharge instructions to the patient's family today when they arrive to transport the patient home by car.   Final next level of care: [place] Barriers to Discharge: No Barriers Identified   Patient Goals and CMS Choice Patient states their goals for this hospitalization and ongoing recovery are:: to go home CMS Medicare.gov Compare Post Acute Care list provided to:: Patient Choice offered to / list presented to : Patient  Discharge Placement  Discharge to Home  with family.                     Discharge Plan and Services In-house Referral: Clinical Social Work, PCP / Curator Services: CM Consult Post Acute Care Choice: Home Health          DME Arranged: Tub bench DME Agency: [employer] Date DME Agency Contacted: 09/15/21 Time DME Agency Contacted: [phone] Representative spoke with at DME Agency: Jeanella Anton, [place] with [employer] to deliver equipment to the hospital room            Social Determinants of Health (SDOH) Interventions     Readmission Risk Interventions     No data to display

## 2021-09-16 NOTE — Discharge Summary (Signed)
Physician Discharge Summary  Scott Grimes BTD:[id number] DOB: 1960/08/06 DOA: 09/07/2021  PCP: Oneita Hurt, No  Admit date: 09/07/2021 Discharge date: 09/16/2021  Admitted From: Home Disposition: Home  Recommendations for Outpatient Follow-up:  Follow up with PCP in 1-2 weeks  Home Health: Home health PT OT versus outpatient physical therapy pending approval Equipment/Devices: No new equipment  Discharge Condition: Stable CODE STATUS: Full Diet recommendation: As tolerated  Brief/Interim Summary: Scott Grimes is a 61 y.o. male with a history of cocaine use, intellectual disability who presented by EMS to the ED on 6/7 after sustaining VT, then VF cardiac arrest after a witnessed seizure with ROSC after CPR and cardioversion. Lidocaine started by EMS and patient intubated in ED. Ultimately extubated and off pressors 6/8, continued on unasyn for aspiration pneumonia. Troponin was elevated, and heparin IV was given for 48 hours. Cardiology consulted and with improved LVEF, recommend no further inpatient investigations.    Clinically improving - no further inpatient workup per discussion with cardiology.  Patient had a prolonged hospitalization due to his unsafe discharge locations as he currently lives alone at home with intellectual delay and profound ambulatory dysfunction initially but now improving.  Further discussion with PT OT as he has been waiting for inpatient rehab approval which was unfortunately denied today he is able to ambulate with minimal assist, OT recommending care at home given his mental status which is certainly reasonable.  Patient thankfully has friend Efraim Kaufmann as well as family members are willing to take care of him at home.  Patient is otherwise medically stable for discharge, lengthy discussion about need to limit patient's access to narcotic medications and illicit substances given his mental status he may not fully comprehend the risk factors involved.  Patient otherwise  stable and agreeable for discharge home to continue therapy, care with family and friends and close follow-up with PCP in the near future.  *Discharge yesterday delayed due to logistics with family/ride availability - remains medically stable for discharge.*  Discharge Diagnoses:  Principal Problem:   Cardiac arrest [facility]) Active Problems:   Protein-calorie malnutrition, severe   Demand ischemia (HCC)   Toxic encephalopathy   Cocaine use with intoxication with complication [facility])  Ventricular tachycardia with out of hospital cardiac arrest:  - Due to cocaine use.  - No further management recommended aside from cocaine cessation -signing off, appreciate their assistance in this case.   Acute systolic CHF:  - Improved by serial echocardiograms.  - No further work up or treatment recommended by cardiology.    Demand myocardial ischemia, type 2 NSTEMI:  - Repeat echo showed improvement w/LVEF ~60-65% with basal inferior hypokinesis.  No further work-up, continue aspirin and statin at discharge   Acute toxic encephalopathy, resolved:  -Chronic intellectual delay noted, currently at baseline   Cocaine use and intoxication:  - +UDS, ? chronic use.  Cessation recommended   Profound ambulatory dysfunction, multifactorial in the setting of above, resolved - Unclear baseline ambulation at home but patient states he uses a walker or cane only as needed and not consistently.  -Continue PT OT outpatient   Hypophosphatemia:  - Resolved with supplementation   Hypoalbuminemia:  - Improve nutritional status.   Acute hypoxic respiratory failure:  -Resolved secondary to above LFT elevation: - Resolved AKI: - Resolved Severe protein calorie malnutrition: - Supplement protein as able Tobacco use: - Cessation counseling  Discharge Instructions  Discharge Instructions     Ambulatory referral to Physical Therapy   Complete by: As directed  Iontophoresis - 4 mg/ml of dexamethasone: No    T.E.N.S. Unit Evaluation and Dispense as Indicated: No   Discharge patient   Complete by: As directed    Discharge disposition: 06-Home-Health Care Svc   Discharge patient date: 09/15/2021   Face-to-face encounter (required for Medicare/Medicaid patients)   Complete by: As directed    [place] certify that this patient is under my care and that I, or a nurse practitioner or physician's assistant working with me, had a face-to-face encounter that meets the physician face-to-face encounter requirements with this patient on 09/15/2021. The encounter with the patient was in whole, or in part for the following medical condition(s) which is the primary reason for home health care (List medical condition): Ambulatory dysfunction   The encounter with the patient was in whole, or in part, for the following medical condition, which is the primary reason for home health care: Ambulatory dysfunction   I certify that, based on my findings, the following services are medically necessary home health services: Physical therapy   Reason for Medically Necessary Home Health Services: Therapy- Personnel officer, Public librarian   My clinical findings support the need for the above services: Unable to leave home safely without assistance and/or assistive device   Further, I certify that my clinical findings support that this patient is homebound due to: Unable to leave home safely without assistance   Home Health   Complete by: As directed    To provide the following care/treatments:  PT [place] work        Allergies as of 09/16/2021   No Known Allergies      Medication List     TAKE these medications    albuterol 108 (90 Base) MCG/ACT inhaler Commonly known as: VENTOLIN HFA Inhale 4 puffs into the lungs every 4 (four) hours as needed for shortness of breath.   aspirin 81 MG chewable tablet Chew 1 tablet (81 mg total) by mouth daily.   feeding  supplement Liqd Take 237 mLs by mouth 3 (three) times daily between meals.   folic acid 1 MG tablet Commonly known as: FOLVITE Take 1 tablet (1 mg total) by mouth daily.   multivitamin with minerals Tabs tablet Take 1 tablet by mouth daily.   rosuvastatin 40 MG tablet Commonly known as: CRESTOR Take 1 tablet (40 mg total) by mouth daily.   thiamine 100 MG tablet Take 1 tablet (100 mg total) by mouth daily.               Durable Medical Equipment  (From admission, onward)           Start     Ordered   09/15/21 1506  For home use only DME Tub bench  Once        09/15/21 1505            Follow-up Information     [place] Follow up.   Contact information: [address] [place] [phone]        [facility]. Call.   Specialty: Rehabilitation Why: Please call to schedule Outpatient therapies at the above number. Contact information: [address] [id number] [place] [phone]               No Known Allergies  Consultations: ICU, cardiology  Procedures/Studies: ECHOCARDIOGRAM LIMITED  Result Date: 09/10/2021    ECHOCARDIOGRAM LIMITED  REPORT   Patient Name:   Scott Grimes Date of Exam: 09/10/2021 Medical Rec #:  [id number]        Height:       71.0 in Accession #:    [id number]       Weight:       163.1 lb Date of Birth:  [birth year]        BSA:          1.933 m Patient Age:    60 years         BP:           118/78 mmHg Patient Gender: M                HR:           78 bpm. Exam Location:  Inpatient Procedure: Limited Echo Indications:    Elevated troponin  History:        Patient has prior history of Echocardiogram examinations, most                 recent 09/08/2021. Stroke, Arrythmias:Cardiac Arrest; Risk                 Factors:Current Smoker. Cocaine induced cardiac arrest. 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  Sonographer:    Roosvelt Maser RDCS Referring Phys: [id number] RAVI AGARWALA IMPRESSIONS  1. Limited study to assess LV function; full images not obtained. Basal inferior hypokinesis with overall preserved LV function.  2. Left ventricular ejection fraction, by estimation, is 60 to 65%. The left ventricle has normal function. The left ventricle demonstrates regional wall motion abnormalities (see scoring diagram/findings for description).  3. Right ventricular systolic function is mildly reduced. The right ventricular size is mildly enlarged.  4. The mitral valve is normal in structure. Mild mitral valve regurgitation.  5. The inferior vena cava is dilated in size with >50% respiratory variability, suggesting right atrial pressure of 8 mmHg. FINDINGS  Left Ventricle: Left ventricular ejection fraction, by estimation, is 60 to 65%. The left ventricle has normal function. The left ventricle demonstrates regional wall motion abnormalities. The left ventricular internal cavity size was normal in size. Right Ventricle: The right ventricular size is mildly enlarged. Right ventricular systolic function is mildly reduced. The tricuspid regurgitant velocity is 2.45 m/s, and with an assumed right atrial pressure of 8 mmHg, the estimated right ventricular systolic pressure is 32.0 mmHg. Left Atrium: Left atrial size was normal in size. Right Atrium: Right atrial size was normal in size. Pericardium: There is no evidence of pericardial effusion. Mitral Valve: The mitral valve is normal in structure. Mild mitral valve regurgitation. Tricuspid Valve: The tricuspid valve is normal in structure. Tricuspid valve regurgitation is mild. Aorta: The aortic root is normal in size and structure. Venous: The inferior vena cava is dilated in size with greater than 50% respiratory variability, suggesting right atrial pressure of 8 mmHg. Additional Comments: Limited study to assess LV  function; full images not obtained. Basal inferior hypokinesis with overall preserved LV function. RIGHT VENTRICLE          IVC RV Basal diam:  4.00 cm  IVC diam: 2.30 cm RV Mid diam:    2.70 cm TRICUSPID VALVE TR Peak grad:   24.0 mmHg TR Vmax:        245.00 cm/s Olga Millers MD Electronically signed  by Kirk Ruths MD Signature Date/Time: 09/10/2021/12:36:49 PM    Final    DG Swallowing Func-Speech Pathology  Result Date: 09/09/2021 Table formatting from the original result was not included. Images from the original result were not included. Objective Swallowing Evaluation: Type of Study: MBS-Modified Barium Swallow Study  Patient Details Name: Scott Grimes MRN: [id number] Date of Birth: Dec 12, 1960 Today's Date: 09/09/2021 Time: SLP Start Time (ACUTE ONLY): 1400 -SLP Stop Time (ACUTE ONLY): 1420 SLP Time Calculation (min) (ACUTE ONLY): 20 min Past Medical History: Past Medical History: Diagnosis Date  COVID-19 02/2021  Low back pain   Stroke [facility])  Past Surgical History: No past surgical history on file. HPI: Pt is a 61 year old male admitted with Cardiac arrest- VT, VF- likley due to acute drug intoxication. Likely NSTEMI likely from demand. Acute encephalopathy, concern for anoxic vs toxic encephalopathy. Sister mentions baseline cognitive impairment and speech difficulties. Intubated for one day 6/7- 6/8.  No data recorded  Recommendations for follow up therapy are one component of a multi-disciplinary discharge planning process, led by the attending physician.  Recommendations may be updated based on patient status, additional functional criteria and insurance authorization. Assessment / Plan / Recommendation   09/09/2021   2:00 PM Clinical Impressions Clinical Impression Pt demonstrates restlessness and poor positioning by the end of MBS. Initially pt was able to sip from a straw upright with thin liquids with no impairment. Pt slid himself to a more horizontal position, and liquids arrived deeper into  the pharynx prior to swallowing with sensed aspiraiton (PAS7). Coughing elicited regurgitation from cervical esophagus. Transitioned to nectar thick liquids with success. Pt tolerated solids well despite missing dentition. Recommend nectar thick liquids and mechanical soft solids. Will f/u for tolerance. SLP Visit Diagnosis Dysphagia, oropharyngeal phase (R13.12) Impact on safety and function Mild aspiration risk     09/09/2021   2:00 PM Treatment Recommendations Treatment Recommendations Therapy as outlined in treatment plan below      No data to display      09/09/2021   2:00 PM Diet Recommendations SLP Diet Recommendations Nectar thick liquid;Dysphagia 3 (Mech soft) solids Liquid Administration via Cup;Straw Medication Administration Whole meds with liquid Compensations Slow rate;Small sips/bites     09/09/2021   2:00 PM Other Recommendations Oral Care Recommendations Oral care BID    No data to display        09/09/2021   2:00 PM Oral Phase Oral Phase Impaired Oral - Nectar Straw WFL Oral - Thin Straw WFL Oral - Puree WFL Oral - Mech Soft [facility]    09/09/2021   2:00 PM Pharyngeal Phase Pharyngeal Phase Impaired Pharyngeal- Nectar Straw Delayed swallow initiation-pyriform sinuses Pharyngeal- Thin Cup Delayed swallow initiation-pyriform sinuses Pharyngeal- Thin Straw Delayed swallow initiation-vallecula;Delayed swallow initiation-pyriform sinuses;Penetration/Aspiration before swallow Pharyngeal Material enters airway, passes BELOW cords and not ejected out despite cough attempt by patient;Material does not enter airway Pharyngeal- Puree WFL Pharyngeal- Regular WFL     No data to display    DeBlois, Katherene Ponto 09/09/2021, 2:38 PM                     CT Angio Chest Pulmonary Embolism (PE) W or WO Contrast  Result Date: 09/08/2021 CLINICAL DATA:  Concern for PE on echo. EXAM: CT ANGIOGRAPHY CHEST WITH CONTRAST TECHNIQUE: Multidetector CT imaging of the chest was performed using the standard protocol during bolus  administration of intravenous contrast. Multiplanar CT image reconstructions and MIPs were obtained to evaluate the  vascular anatomy. RADIATION DOSE REDUCTION: This exam was performed according to the departmental dose-optimization program which includes automated exposure control, adjustment of the mA and/or kV according to patient size and/or use of iterative reconstruction technique. CONTRAST:  164mL OMNIPAQUE IOHEXOL 350 MG/ML SOLN COMPARISON:  None Available. FINDINGS: Cardiovascular: Satisfactory opacification of the pulmonary arteries to the segmental level. No evidence of pulmonary embolism. Normal heart size. No pericardial effusion. There are atherosclerotic calcifications of the coronary arteries. Mediastinum/Nodes: There is some mildly hyperdense fluid and stranding in the anterior mediastinum at the level of the heart. There are no enlarged mediastinal or hilar lymph nodes. The visualized thyroid gland is within normal limits. Small hiatal hernia is present. Lungs/Pleura: Severe emphysematous changes are present. There is diffuse peribronchial wall thickening, most significant in the right lower lobe. Trachea and central airways are patent. There is some patchy airspace disease and ground-glass opacity in the bilateral lung bases/lower lobes. There are trace bilateral pleural effusions. Upper Abdomen: No acute abnormality. Musculoskeletal: No chest wall abnormality. No acute or significant osseous findings. Review of the MIP images confirms the above findings. IMPRESSION: 1. No evidence for pulmonary embolism. 2. Small amount of fluid and stranding in the anterior mediastinum of uncertain etiology. Please correlate clinically for infection or resolving anterior mediastinal hematoma. 3. Diffuse peribronchial wall thickening may be infectious/inflammatory or related to edema. 4. Minimal airspace and ground-glass opacities in the bilateral lower lobes worrisome for infection. 5. Trace bilateral pleural  effusions. 6. Severe emphysema. Emphysema (ICD10-J43.9). Electronically Signed   By: Ronney Asters M.D.   On: 09/08/2021 21:34   ECHOCARDIOGRAM COMPLETE  Result Date: 09/08/2021    ECHOCARDIOGRAM REPORT   Patient Name:   Scott Grimes Date of Exam: 09/08/2021 Medical Rec #:  [id number]        Height:       71.0 in Accession #:    [id number]       Weight:       162.0 lb Date of Birth:  Mar 18, 1961        BSA:          1.928 m Patient Age:    35 years         BP:           101/78 mmHg Patient Gender: M                HR:           74 bpm. Exam Location:  Inpatient Procedure: 2D Echo, Cardiac Doppler and Color Doppler Indications:   Cardiac arrest  History:       Patient has no prior history of Echocardiogram examinations.                Stroke.  Sonographer:   [place] Referring      [phone] Julian Hy Phys:  Sonographer Comments: Technically challenging study due to limited acoustic windows and echo performed with patient supine and on artificial respirator. IMPRESSIONS  1. Anteroseptal and inferoseptal hypokinesis. Left ventricular ejection fraction, by estimation, is 40 to 45%. The left ventricle has mildly decreased function. The left ventricle demonstrates regional wall motion abnormalities (see scoring diagram/findings for description). Left ventricular diastolic parameters are indeterminate. There is the interventricular septum is flattened in systole and diastole, consistent with right ventricular pressure and volume overload.  2. McConnell's sign. Severe RV hypokinesis with sparing of the apex. . Right ventricular systolic function is severely reduced. The right ventricular size is mildly  enlarged. There is normal pulmonary artery systolic pressure.  3. Left atrial size was moderately dilated.  4. Right atrial size was moderately dilated.  5. The mitral valve is normal in structure. No evidence of mitral valve regurgitation. No evidence of mitral stenosis.  6. The aortic valve is tricuspid.  Aortic valve regurgitation is not visualized. No aortic stenosis is present.  7. The inferior vena cava is dilated in size with <50% respiratory variability, suggesting right atrial pressure of 15 mmHg. FINDINGS  Left Ventricle: Anteroseptal and inferoseptal hypokinesis. Left ventricular ejection fraction, by estimation, is 40 to 45%. The left ventricle has mildly decreased function. The left ventricle demonstrates regional wall motion abnormalities. The left ventricular internal cavity size was normal in size. There is no left ventricular hypertrophy. The interventricular septum is flattened in systole and diastole, consistent with right ventricular pressure and volume overload. Left ventricular diastolic parameters are indeterminate. Right Ventricle: McConnell's sign. Severe RV hypokinesis with sparing of the apex. The right ventricular size is mildly enlarged. No increase in right ventricular wall thickness. Right ventricular systolic function is severely reduced. There is normal pulmonary artery systolic pressure. The tricuspid regurgitant velocity is 2.13 m/s, and with an assumed right atrial pressure of 15 mmHg, the estimated right ventricular systolic pressure is 33.1 mmHg. Left Atrium: Left atrial size was moderately dilated. Right Atrium: Right atrial size was moderately dilated. Prominent Eustachian valve. Pericardium: Trivial pericardial effusion is present. Mitral Valve: The mitral valve is normal in structure. No evidence of mitral valve regurgitation. No evidence of mitral valve stenosis. Tricuspid Valve: The tricuspid valve is normal in structure. Tricuspid valve regurgitation is mild . No evidence of tricuspid stenosis. Aortic Valve: The aortic valve is tricuspid. Aortic valve regurgitation is not visualized. No aortic stenosis is present. Aortic valve mean gradient measures 3.0 mmHg. Aortic valve peak gradient measures 6.7 mmHg. Aortic valve area, by VTI measures 4.07 cm. Pulmonic Valve: The  pulmonic valve was normal in structure. Pulmonic valve regurgitation is not visualized. No evidence of pulmonic stenosis. Aorta: The aortic root is normal in size and structure. Venous: The inferior vena cava is dilated in size with less than 50% respiratory variability, suggesting right atrial pressure of 15 mmHg. IAS/Shunts: No atrial level shunt detected by color flow Doppler. Additional Comments: McConnell's sign. Severe RV hypokinesis with sparing of the apex. Consider pulmonary embolism.  LEFT VENTRICLE PLAX 2D LVIDd:         4.30 cm     Diastology LVIDs:         3.50 cm     LV e' medial:  9.48 cm/s LV PW:         1.00 cm     LV e' lateral: 17.30 cm/s LV IVS:        0.90 cm LVOT diam:     2.40 cm LV SV:         93 LV SV Index:   48 LVOT Area:     4.52 cm  LV Volumes (MOD) LV vol d, MOD A4C: 61.2 ml LV vol s, MOD A4C: 42.1 ml LV SV MOD A4C:     61.2 ml RIGHT VENTRICLE RV Basal diam:  4.10 cm RV Mid diam:    4.20 cm RV S prime:     13.90 cm/s TAPSE (M-mode): 1.9 cm LEFT ATRIUM             Index        RIGHT ATRIUM  Index LA Vol (A2C):   69.5 ml 36.05 ml/m  RA Area:     17.50 cm LA Vol (A4C):   71.5 ml 37.08 ml/m  RA Volume:   47.30 ml  24.53 ml/m LA Biplane Vol: 72.8 ml 37.76 ml/m  AORTIC VALVE                    PULMONIC VALVE AV Area (Vmax):    3.86 cm     PV Vmax:       0.88 m/s AV Area (Vmean):   3.68 cm     PV Vmean:      60.750 cm/s AV Area (VTI):     4.07 cm     PV VTI:        0.194 m AV Vmax:           129.00 cm/s  PV Peak grad:  3.1 mmHg AV Vmean:          83.400 cm/s  PV Mean grad:  1.5 mmHg AV VTI:            0.228 m AV Peak Grad:      6.7 mmHg AV Mean Grad:      3.0 mmHg LVOT Vmax:         110.00 cm/s LVOT Vmean:        67.900 cm/s LVOT VTI:          0.205 m LVOT/AV VTI ratio: 0.90  AORTA Ao Root diam: 3.40 cm TRICUSPID VALVE TR Peak grad:   18.1 mmHg TR Vmax:        213.00 cm/s  SHUNTS Systemic VTI:  0.20 m Systemic Diam: 2.40 cm Skeet Latch MD Electronically signed by Skeet Latch MD Signature Date/Time: 09/08/2021/3:40:58 PM    Final    EEG adult  Result Date: 09/08/2021 Lora Havens, MD     09/08/2021  9:20 AM Patient Name: Scott Grimes MRN: [id number] Epilepsy Attending: Lora Havens Referring Physician/Provider: Corey Harold, NP Date: 09/08/2021 Duration: 22.18 mins Patient history: 61 year old male status post cardiac arrest.  EEG to evaluate for seizure. Level of alertness: Lethargic AEDs during EEG study: LEV, propofol Technical aspects: This EEG study was done with scalp electrodes positioned according to the 10-20 International system of electrode placement. Electrical activity was acquired at a sampling rate of 500Hz  and reviewed with a high frequency filter of 70Hz  and a low frequency filter of 1Hz . EEG data were recorded continuously and digitally stored. Description: EEG showed continuous generalized 5 to 7 Hz theta slowing admixed with intermittent generalized 2-3Hz  delta slowing. Hyperventilation and photic stimulation were not performed.   ABNORMALITY - Continuous slow, generalized IMPRESSION: This study is suggestive of moderate to severe diffuse encephalopathy, nonspecific etiology. No seizures or epileptiform discharges were seen throughout the recording. Lora Havens   DG Abd Portable 1 View  Result Date: 09/08/2021 CLINICAL DATA:  OG tube placement. EXAM: PORTABLE ABDOMEN - 1 VIEW COMPARISON:  None Available. FINDINGS: Paucity of gas in the small and large bowel. Small amount of air is noted in the rectum. An enteric tube terminates in the stomach. No radio-opaque calculi or other significant radiographic abnormality are seen. IMPRESSION: 1. Paucity of bowel gas in the small and large bowel and small amount of gas in the rectum. Clinical correlation is recommended. 2. The enteric tube terminates in the stomach. Electronically Signed   By: Brett Fairy M.D.   On: 09/08/2021 00:33   DG Chest  Portable 1 View  Result Date: 09/08/2021 CLINICAL  DATA:  Endotracheal tube placement. EXAM: PORTABLE CHEST 1 VIEW COMPARISON:  09/07/2021. FINDINGS: The heart size and mediastinal contours are within normal limits. The visualized lungs are clear. In the right lateral lung is excluded from the field of view. No definite effusion or pneumothorax. No acute osseous abnormality. The endotracheal tube terminates 5.0 cm above the carina. An enteric tube terminates in the stomach. IMPRESSION: 1. No acute abnormality. 2. Medical devices as described above. Electronically Signed   By: Brett Fairy M.D.   On: 09/08/2021 00:29   CT Head Wo Contrast  Result Date: 09/07/2021 CLINICAL DATA:  Seizure disorder, clinical change.  Post CPR EXAM: CT HEAD WITHOUT CONTRAST TECHNIQUE: Contiguous axial images were obtained from the base of the skull through the vertex without intravenous contrast. RADIATION DOSE REDUCTION: This exam was performed according to the departmental dose-optimization program which includes automated exposure control, adjustment of the mA and/or kV according to patient size and/or use of iterative reconstruction technique. COMPARISON:  CT head 01/30/2021 BRAIN: BRAIN Brain: Patchy and confluent areas of decreased attenuation are noted throughout the deep and periventricular white matter of the cerebral hemispheres bilaterally, compatible with chronic microvascular ischemic disease. No evidence of large-territorial acute infarction. No parenchymal hemorrhage. No mass lesion. No extra-axial collection. No mass effect or midline shift. No hydrocephalus. Basilar cisterns are patent. Vascular: No hyperdense vessel. Skull: No acute fracture or focal lesion. Sinuses/Orbits: Mucosal thickening of the bilateral maxillary, sphenoid, ethmoid sinuses. Paranasal sinuses and mastoid air cells are clear. The orbits are unremarkable. Other: Frothy secretion within the nasal cavities and nasopharynx. IMPRESSION: 1. No acute intracranial abnormality. 2. Frothy secretion within  the nasal cavities and nasopharynx. Electronically Signed   By: Iven Finn M.D.   On: 09/07/2021 23:38   DG Chest Portable 1 View  Result Date: 09/07/2021 CLINICAL DATA:  Post CPR. EXAM: PORTABLE CHEST 1 VIEW COMPARISON:  Chest x-ray 01/30/2021 FINDINGS: Endotracheal tube tip is 6 cm above the carina. Enteric tube extends below the diaphragm. Lungs are clear. There is no pleural effusion or pneumothorax. No acute fractures are seen. Cardiomediastinal silhouette is within normal limits. IMPRESSION: 1. Endotracheal tube tip 6 cm above carina. 2. Enteric tube extends below the diaphragm. 3. The lungs are clear. Electronically Signed   By: Ronney Asters M.D.   On: 09/07/2021 23:13     Subjective: No acute issues or events overnight   Discharge Exam: Vitals:   09/15/21 1411 09/15/21 1957  BP: 101/68 117/75  Pulse: (!) 54 69  Resp: 16   Temp: 98.4 F (36.9 C) 97.6 F (36.4 C)  SpO2: 99% 98%   Vitals:   09/14/21 2007 09/15/21 0855 09/15/21 1411 09/15/21 1957  BP: 99/72 (!) 94/59 101/68 117/75  Pulse: 64 64 (!) 54 69  Resp: 16 18 16    Temp: 98.5 F (36.9 C) 97.8 F (36.6 C) 98.4 F (36.9 C) 97.6 F (36.4 C)  TempSrc: Oral Oral Oral Oral  SpO2: 95% 98% 99% 98%  Weight:      Height:        General: Pt is alert, awake, not in acute distress Cardiovascular: RRR, S1/S2 +, no rubs, no gallops Respiratory: CTA bilaterally, no wheezing, no rhonchi Abdominal: Soft, NT, ND, bowel sounds + Extremities: no edema, no cyanosis    The results of significant diagnostics from this hospitalization (including imaging, microbiology, ancillary and laboratory) are listed below for reference.     Microbiology: Recent  Results (from the past 240 hour(s))  Resp Panel by RT-PCR (Flu A&B, Covid) Anterior Nasal Swab     Status: None   Collection Time: 09/07/21 11:19 PM   Specimen: Anterior Nasal Swab  Result Value Ref Range Status   SARS Coronavirus 2 by RT PCR NEGATIVE NEGATIVE Final     Comment: (NOTE) SARS-CoV-2 target nucleic acids are NOT DETECTED.  The SARS-CoV-2 RNA is generally detectable in upper respiratory specimens during the acute phase of infection. The lowest concentration of SARS-CoV-2 viral copies this assay can detect is 138 copies/mL. A negative result does not preclude SARS-Cov-2 infection and should not be used as the sole basis for treatment or other patient management decisions. A negative result may occur with  improper specimen collection/handling, submission of specimen other than nasopharyngeal swab, presence of viral mutation(s) within the areas targeted by this assay, and inadequate number of viral copies(<138 copies/mL). A negative result must be combined with clinical observations, patient history, and epidemiological information. The expected result is Negative.  Fact Sheet for Patients:  EntrepreneurPulse.com.au  Fact Sheet for Healthcare Providers:  IncredibleEmployment.be  This test is no t yet approved or cleared by the [place] FDA and  has been authorized for detection and/or diagnosis of SARS-CoV-2 by FDA under an Emergency Use Authorization (EUA). This EUA will remain  in effect (meaning this test can be used) for the duration of the COVID-19 declaration under Section 564(b)(1) of the Act, 21 U.S.C.section 360bbb-3(b)(1), unless the authorization is terminated  or revoked sooner.       Influenza A by PCR NEGATIVE NEGATIVE Final   Influenza B by PCR NEGATIVE NEGATIVE Final    Comment: (NOTE) The Xpert Xpress SARS-CoV-2/FLU/RSV plus assay is intended as an aid in the diagnosis of influenza from Nasopharyngeal swab specimens and should not be used as a sole basis for treatment. Nasal washings and aspirates are unacceptable for Xpert Xpress SARS-CoV-2/FLU/RSV testing.  Fact Sheet for Patients: EntrepreneurPulse.com.au  Fact Sheet for Healthcare  Providers: IncredibleEmployment.be  This test is not yet approved or cleared by the [place] FDA and has been authorized for detection and/or diagnosis of SARS-CoV-2 by FDA under an Emergency Use Authorization (EUA). This EUA will remain in effect (meaning this test can be used) for the duration of the COVID-19 declaration under Section 564(b)(1) of the Act, 21 U.S.C. section 360bbb-3(b)(1), unless the authorization is terminated or revoked.  Performed at [facility], [place] [address], [place], [place] [address]   Culture, blood (routine x 2)     Status: None   Collection Time: 09/07/21 11:25 PM   Specimen: BLOOD  Result Value Ref Range Status   Specimen Description BLOOD RIGHT ANTECUBITAL  Final   Special Requests   Final    BOTTLES DRAWN AEROBIC AND ANAEROBIC Blood Culture results may not be optimal due to an excessive volume of blood received in culture bottles   Culture   Final    NO GROWTH 5 DAYS Performed at [facility], [place] [address], [place], [place] [address]    Report Status 09/13/2021 FINAL  Final  Culture, blood (routine x 2)     Status: None   Collection Time: 09/07/21 11:25 PM   Specimen: BLOOD LEFT HAND  Result Value Ref Range Status   Specimen Description BLOOD LEFT HAND  Final   Special Requests AEROBIC BOTTLE ONLY Blood Culture adequate volume  Final   Culture   Final    NO GROWTH 5 DAYS  Performed at [facility], [place] [address], [place], [place] [address]    Report Status 09/13/2021 FINAL  Final  MRSA Next Gen by PCR, Nasal     Status: None   Collection Time: 09/08/21  5:47 AM   Specimen: Nasal Mucosa; Nasal Swab  Result Value Ref Range Status   MRSA by PCR Next Gen NOT DETECTED NOT DETECTED Final    Comment: (NOTE) The GeneXpert MRSA Assay (FDA approved for NASAL specimens only), is one component of a comprehensive MRSA colonization surveillance program. It is not intended to diagnose MRSA infection nor to  guide or monitor treatment for MRSA infections. Test performance is not FDA approved in patients less than 39 years old. Performed at [facility], [place] [address] [place], [place] [address]      Labs: BNP (last 3 results) No results for input(s): "BNP" in the last 8760 hours. Basic Metabolic Panel: Recent Labs  Lab 09/10/21 0139 09/11/21 0039 09/12/21 0147  NA 137 138 140  K 3.5 3.8 3.8  CL 106 107 111  CO2 28 24 22   GLUCOSE 93 104* 92  BUN 7 8 9   CREATININE 1.04 1.08 1.19  CALCIUM 8.2* 8.1* 8.6*  MG 1.9 1.8 2.0  PHOS 1.3* 4.0 3.5    Liver Function Tests: Recent Labs  Lab 09/10/21 0139  AST 28  ALT 28  ALKPHOS 81  BILITOT 0.4  PROT 5.3*  ALBUMIN 2.7*    No results for input(s): "LIPASE", "AMYLASE" in the last 168 hours. No results for input(s): "AMMONIA" in the last 168 hours. CBC: Recent Labs  Lab 09/10/21 0139 09/11/21 0039 09/12/21 0147  WBC 7.8 7.0 6.8  HGB 11.2* 11.9* 12.8*  HCT 34.1* 35.5* 37.5*  MCV 95.3 94.2 93.3  PLT 256 275 288    Cardiac Enzymes: No results for input(s): "CKTOTAL", "CKMB", "CKMBINDEX", "TROPONINI" in the last 168 hours. BNP: Invalid input(s): "POCBNP" CBG: Recent Labs  Lab 09/14/21 2158 09/15/21 0742 09/15/21 1119 09/15/21 1609 09/15/21 1958  GLUCAP 112* 86 75 104* 127*    D-Dimer No results for input(s): "DDIMER" in the last 72 hours. Hgb A1c No results for input(s): "HGBA1C" in the last 72 hours. Lipid Profile No results for input(s): "CHOL", "HDL", "LDLCALC", "TRIG", "CHOLHDL", "LDLDIRECT" in the last 72 hours. Thyroid function studies No results for input(s): "TSH", "T4TOTAL", "T3FREE", "THYROIDAB" in the last 72 hours.  Invalid input(s): "FREET3" Anemia work up No results for input(s): "VITAMINB12", "FOLATE", "FERRITIN", "TIBC", "IRON", "RETICCTPCT" in the last 72 hours. Urinalysis    Component Value Date/Time   COLORURINE YELLOW 09/07/2021 2305   APPEARANCEUR HAZY (A) 09/07/2021 2305    LABSPEC 1.010 09/07/2021 2305   PHURINE 5.0 09/07/2021 2305   GLUCOSEU 50 (A) 09/07/2021 2305   HGBUR SMALL (A) 09/07/2021 2305   BILIRUBINUR NEGATIVE 09/07/2021 2305   KETONESUR NEGATIVE 09/07/2021 2305   PROTEINUR 100 (A) 09/07/2021 2305   NITRITE NEGATIVE 09/07/2021 2305   LEUKOCYTESUR NEGATIVE 09/07/2021 2305   Sepsis Labs Recent Labs  Lab 09/10/21 0139 09/11/21 0039 09/12/21 0147  WBC 7.8 7.0 6.8    Microbiology Recent Results (from the past 240 hour(s))  Resp Panel by RT-PCR (Flu A&B, Covid) Anterior Nasal Swab     Status: None   Collection Time: 09/07/21 11:19 PM   Specimen: Anterior Nasal Swab  Result Value Ref Range Status   SARS Coronavirus 2 by RT PCR NEGATIVE NEGATIVE Final    Comment: (NOTE) SARS-CoV-2 target nucleic acids are  NOT DETECTED.  The SARS-CoV-2 RNA is generally detectable in upper respiratory specimens during the acute phase of infection. The lowest concentration of SARS-CoV-2 viral copies this assay can detect is 138 copies/mL. A negative result does not preclude SARS-Cov-2 infection and should not be used as the sole basis for treatment or other patient management decisions. A negative result may occur with  improper specimen collection/handling, submission of specimen other than nasopharyngeal swab, presence of viral mutation(s) within the areas targeted by this assay, and inadequate number of viral copies(<138 copies/mL). A negative result must be combined with clinical observations, patient history, and epidemiological information. The expected result is Negative.  Fact Sheet for Patients:  EntrepreneurPulse.com.au  Fact Sheet for Healthcare Providers:  IncredibleEmployment.be  This test is no t yet approved or cleared by the [place] FDA and  has been authorized for detection and/or diagnosis of SARS-CoV-2 by FDA under an Emergency Use Authorization (EUA). This EUA will remain  in effect (meaning  this test can be used) for the duration of the COVID-19 declaration under Section 564(b)(1) of the Act, 21 U.S.C.section 360bbb-3(b)(1), unless the authorization is terminated  or revoked sooner.       Influenza A by PCR NEGATIVE NEGATIVE Final   Influenza B by PCR NEGATIVE NEGATIVE Final    Comment: (NOTE) The Xpert Xpress SARS-CoV-2/FLU/RSV plus assay is intended as an aid in the diagnosis of influenza from Nasopharyngeal swab specimens and should not be used as a sole basis for treatment. Nasal washings and aspirates are unacceptable for Xpert Xpress SARS-CoV-2/FLU/RSV testing.  Fact Sheet for Patients: EntrepreneurPulse.com.au  Fact Sheet for Healthcare Providers: IncredibleEmployment.be  This test is not yet approved or cleared by the [place] FDA and has been authorized for detection and/or diagnosis of SARS-CoV-2 by FDA under an Emergency Use Authorization (EUA). This EUA will remain in effect (meaning this test can be used) for the duration of the COVID-19 declaration under Section 564(b)(1) of the Act, 21 U.S.C. section 360bbb-3(b)(1), unless the authorization is terminated or revoked.  Performed at [facility], [place] [address], [place], [place] [address]   Culture, blood (routine x 2)     Status: None   Collection Time: 09/07/21 11:25 PM   Specimen: BLOOD  Result Value Ref Range Status   Specimen Description BLOOD RIGHT ANTECUBITAL  Final   Special Requests   Final    BOTTLES DRAWN AEROBIC AND ANAEROBIC Blood Culture results may not be optimal due to an excessive volume of blood received in culture bottles   Culture   Final    NO GROWTH 5 DAYS Performed at [facility], [place] [address], [place], [place] [address]    Report Status 09/13/2021 FINAL  Final  Culture, blood (routine x 2)     Status: None   Collection Time: 09/07/21 11:25 PM   Specimen: BLOOD LEFT HAND  Result Value Ref Range Status   Specimen  Description BLOOD LEFT HAND  Final   Special Requests AEROBIC BOTTLE ONLY Blood Culture adequate volume  Final   Culture   Final    NO GROWTH 5 DAYS Performed at [facility], [place] [address] [place], [place] [address]    Report Status 09/13/2021 FINAL  Final  MRSA Next Gen by PCR, Nasal     Status: None   Collection Time: 09/08/21  5:47 AM   Specimen: Nasal Mucosa; Nasal Swab  Result Value Ref Range Status   MRSA by PCR Next Gen  NOT DETECTED NOT DETECTED Final    Comment: (NOTE) The GeneXpert MRSA Assay (FDA approved for NASAL specimens only), is one component of a comprehensive MRSA colonization surveillance program. It is not intended to diagnose MRSA infection nor to guide or monitor treatment for MRSA infections. Test performance is not FDA approved in patients less than 97 years old. Performed at [facility], [place] [address], [place], [place] [address]      Time coordinating discharge: Over 30 minutes  SIGNED:   Little Ishikawa, DO [facility] 09/16/2021, 7:17 AM Pager   If 7PM-7AM, please contact night-coverage [email or web address]

## 2021-11-10 ENCOUNTER — Ambulatory Visit (INDEPENDENT_AMBULATORY_CARE_PROVIDER_SITE_OTHER): Payer: Medicare Other | Admitting: Primary Care

## 2023-03-26 IMAGING — CT CT HEAD W/O CM
4 series · 15 of 47 positions shown, 17 images · non-contrast
Comparison: CT head 01/30/2021

CLINICAL DATA: Seizure disorder, clinical change.  Post CPR



[Series 3: head wo · axial · 0.45mm/px · z∈[-170,-50]mm · 7 of 33 slices shown, 9 images]
[im 5/33  brain]
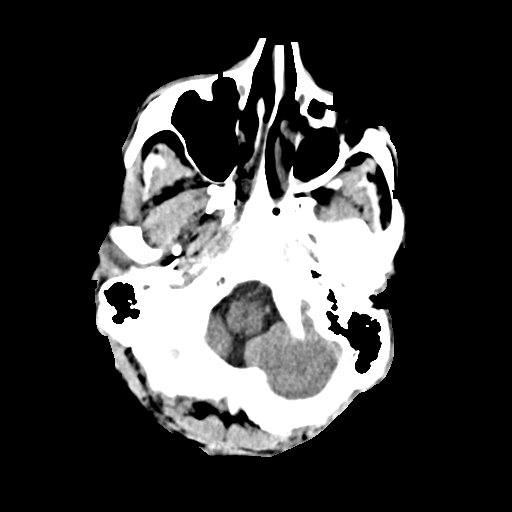
[im 5/33  bone]
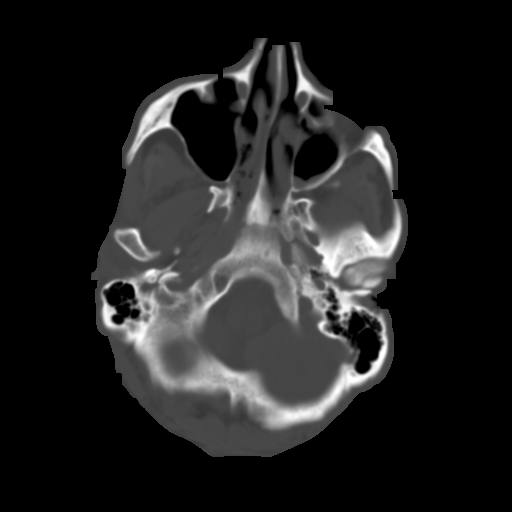
[im 9/33  brain]
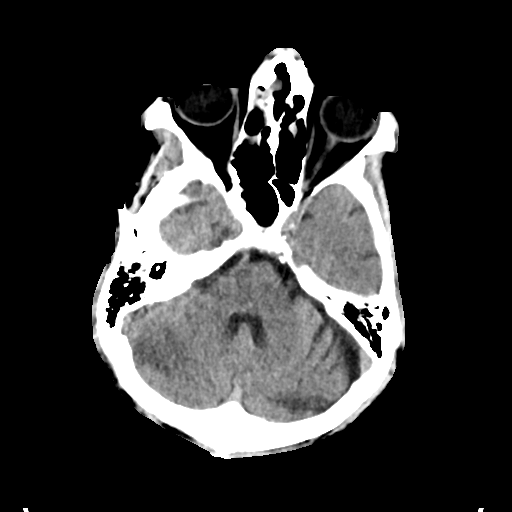
[im 13/33  brain]
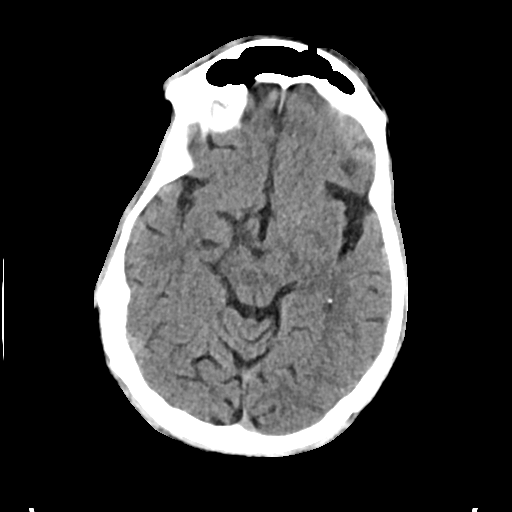
[im 17/33  brain]
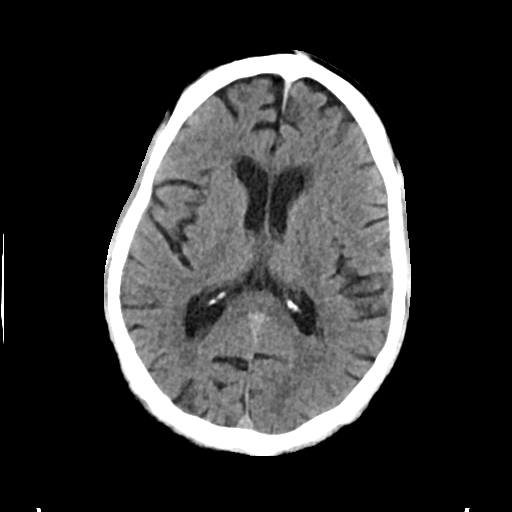
[im 21/33  brain]
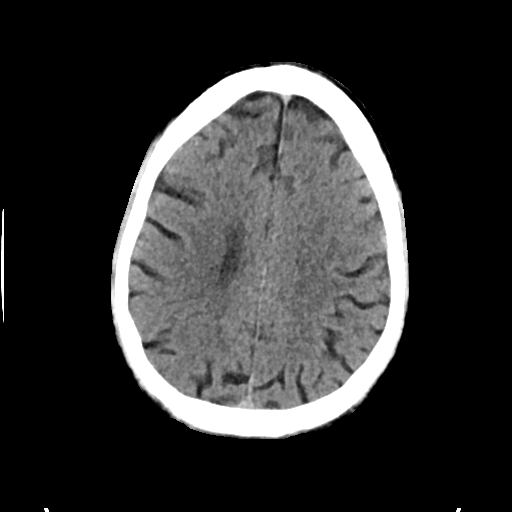
[im 21/33  bone]
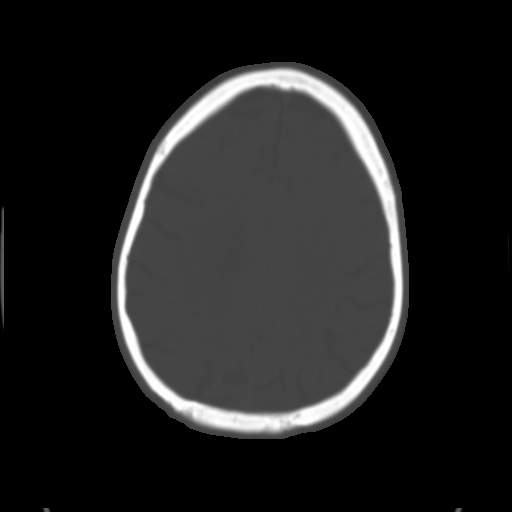
[im 25/33  brain]
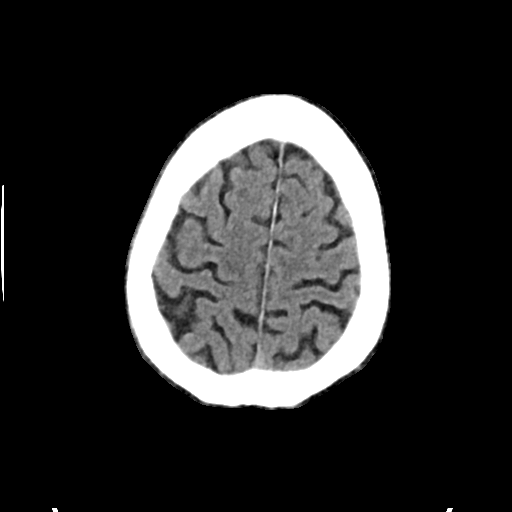
[im 29/33  brain]
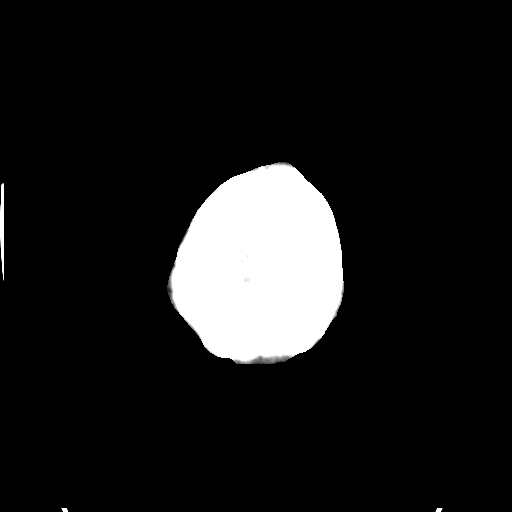

[Series 4: head bone · axial · 0.45mm/px · z∈[-174,-158]mm · 2 of 82 slices shown]
[im 9/82  bone]
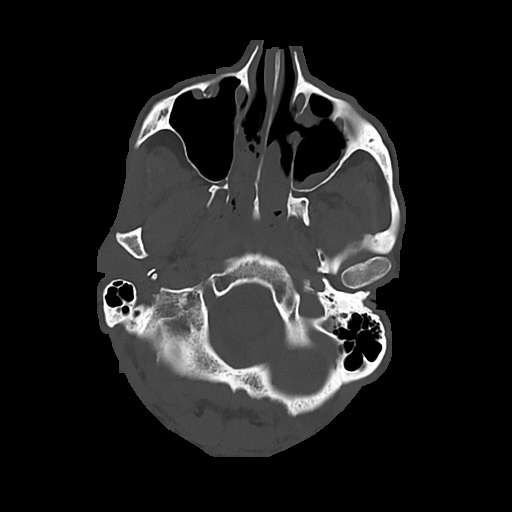
[im 17/82  bone]
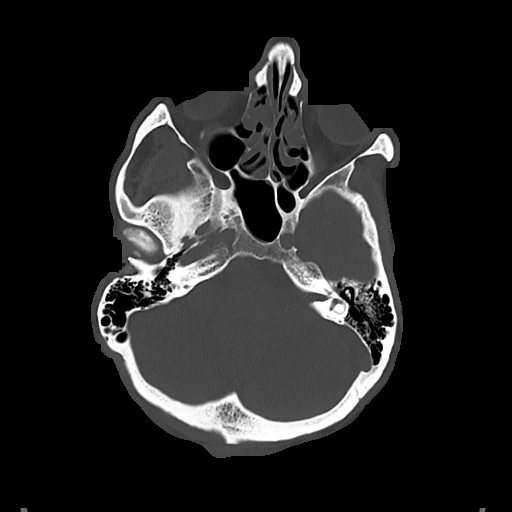

[Series 5: cor soft · coronal · 0.31mm/px · 3 of 66 slices shown]
[im 22/66  brain]
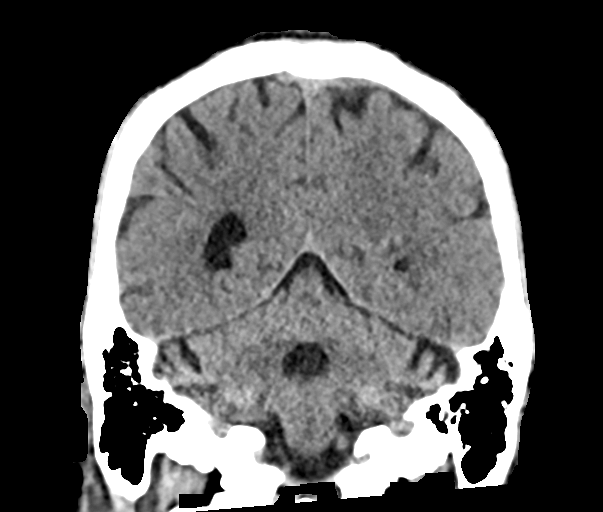
[im 29/66  brain]
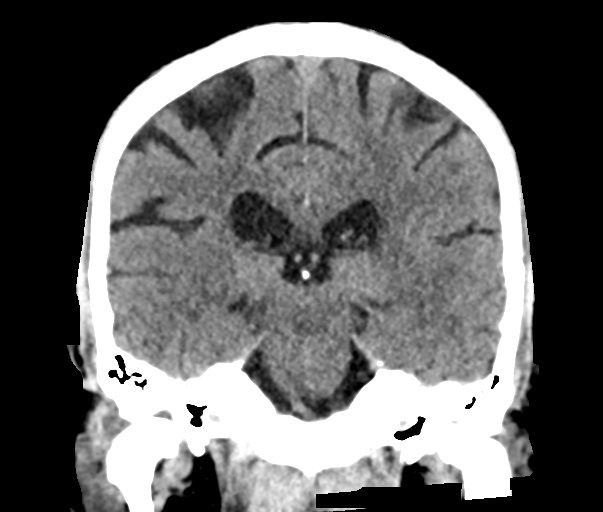
[im 37/66  brain]
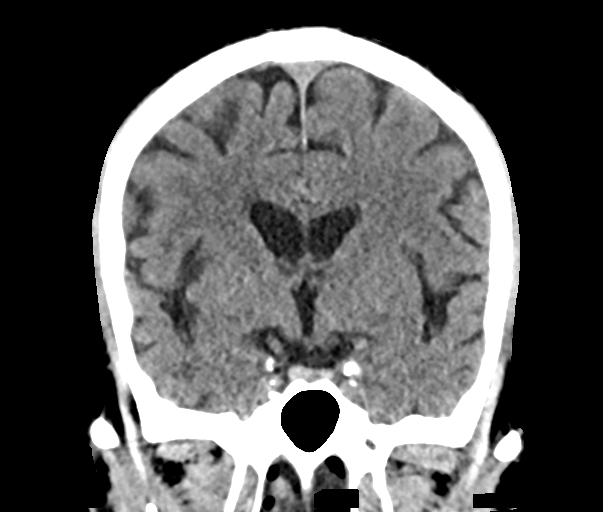

[Series 6: sag soft · sagittal · 0.31mm/px · 3 of 57 slices shown]
[im 19/57  brain]
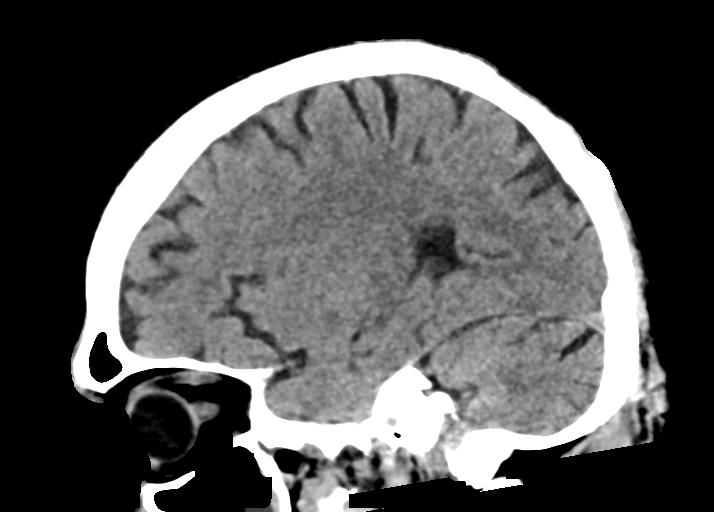
[im 29/57  brain]
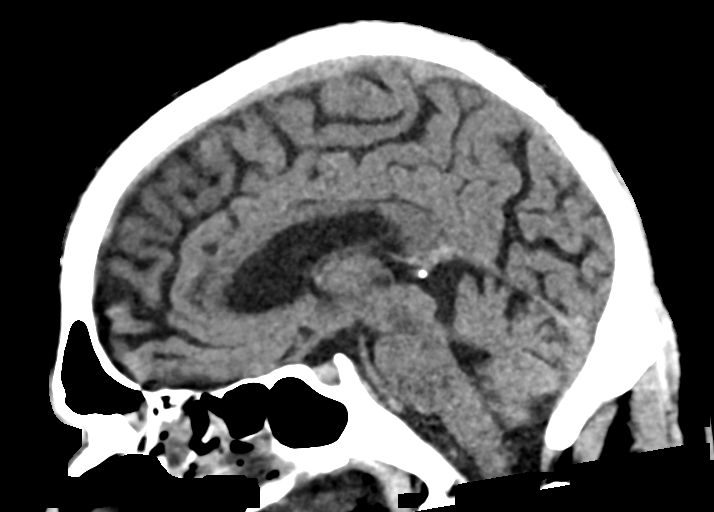
[im 38/57  brain]
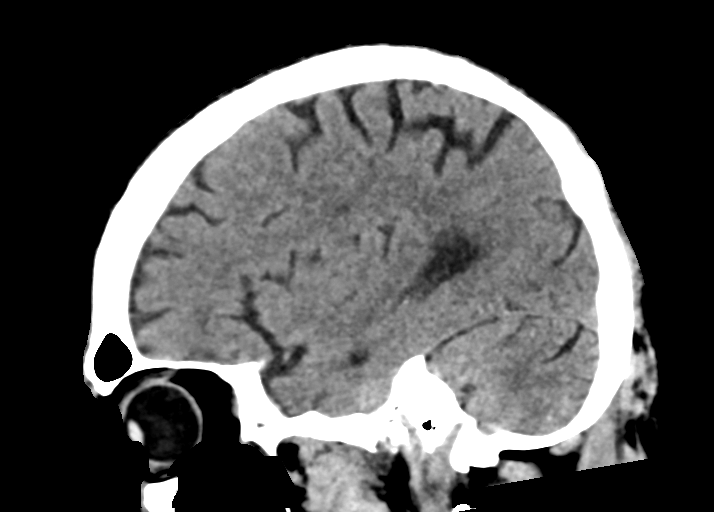

[15 of 47 positions shown; findings below may reference images not displayed]

BRAIN:
BRAIN
Brain:

Patchy and confluent areas of decreased attenuation are noted
throughout the deep and periventricular white matter of the cerebral
hemispheres bilaterally, compatible with chronic microvascular
ischemic disease.

No evidence of large-territorial acute infarction. No parenchymal
hemorrhage. No mass lesion. No extra-axial collection.

No mass effect or midline shift. No hydrocephalus. Basilar cisterns
are patent.

Vascular: No hyperdense vessel.

Skull: No acute fracture or focal lesion.

Sinuses/Orbits: Mucosal thickening of the bilateral maxillary,
sphenoid, ethmoid sinuses. Paranasal sinuses and mastoid air cells
are clear. The orbits are unremarkable.

Other: Frothy secretion within the nasal cavities and nasopharynx.
IMPRESSION: 1. No acute intracranial abnormality.
2. Frothy secretion within the nasal cavities and nasopharynx.

## 2023-03-27 IMAGING — CT CT ANGIO CHEST
2 of 7 series · 18 of 46 positions shown · IV contrast (APPLIED)
Comparison: None Available.

CLINICAL DATA: Concern for PE on echo.

EXAM:
CT ANGIOGRAPHY CHEST WITH CONTRAST
TECHNIQUE: Multidetector CT imaging of the chest was performed using the
standard protocol during bolus administration of intravenous
contrast. Multiplanar CT image reconstructions and MIPs were
obtained to evaluate the vascular anatomy.

[Series 6: thins · axial · 0.72mm/px · z∈[+1093,+1387]mm · 15 of 473 slices shown]
[im 27/473  lung]
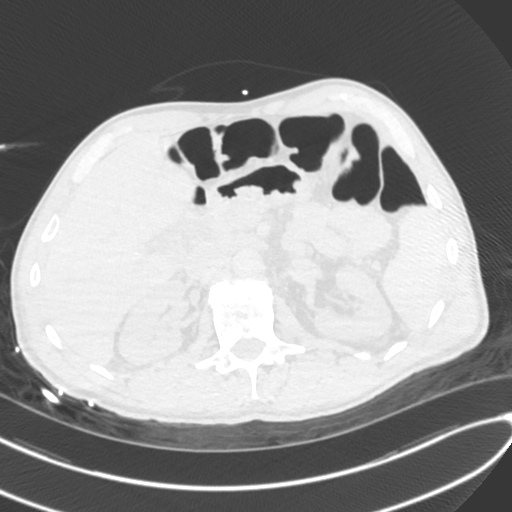
[im 53/473  soft-tissue]
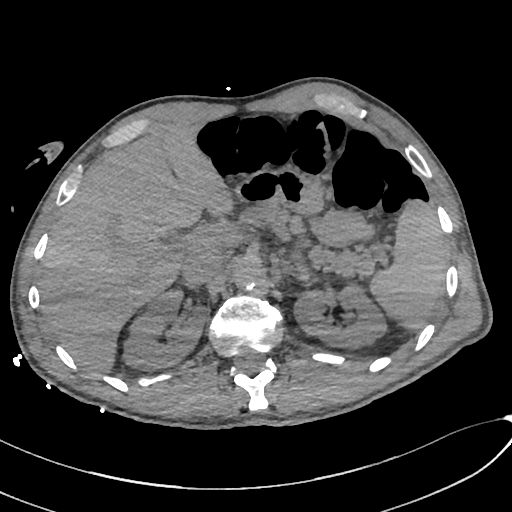
[im 79/473  lung]
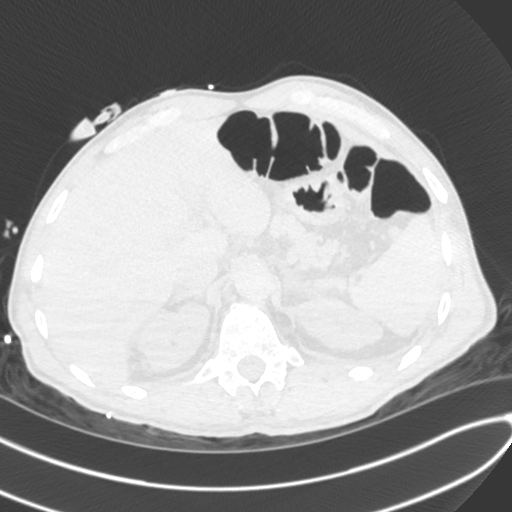
[im 105/473  soft-tissue]
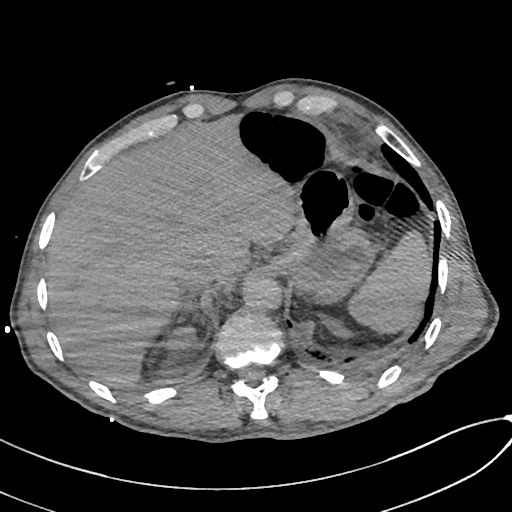
[im 158/473  lung]
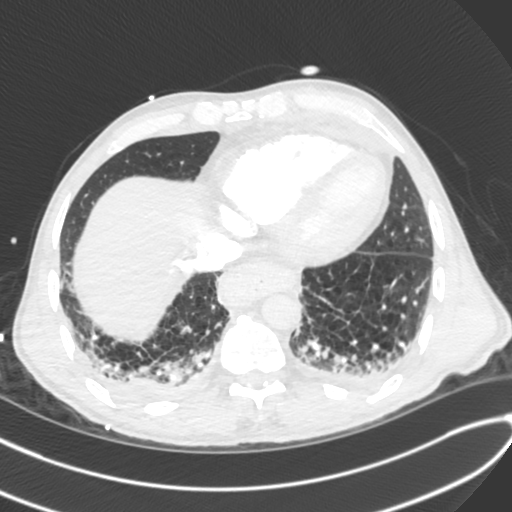
[im 184/473  soft-tissue]
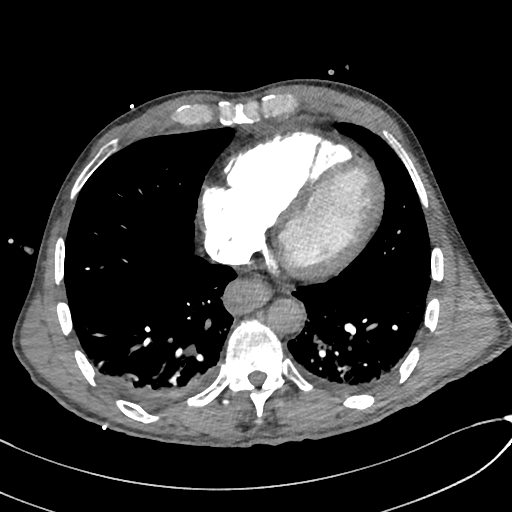
[im 210/473  lung]
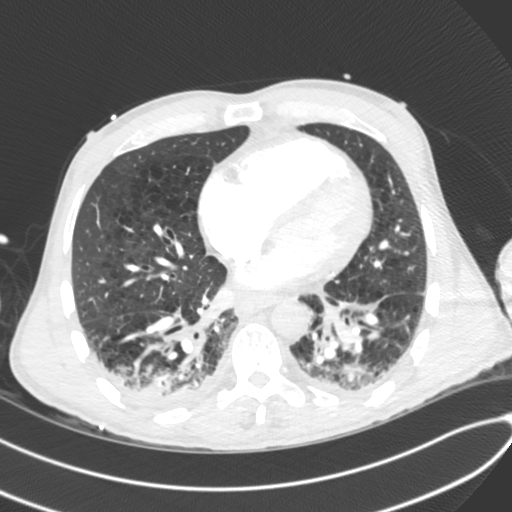
[im 237/473  soft-tissue]
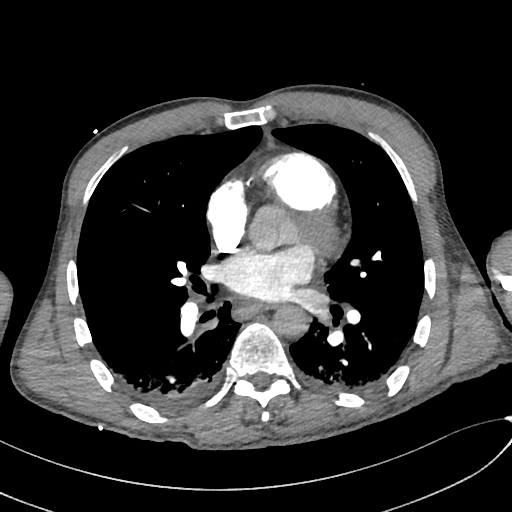
[im 263/473  lung]
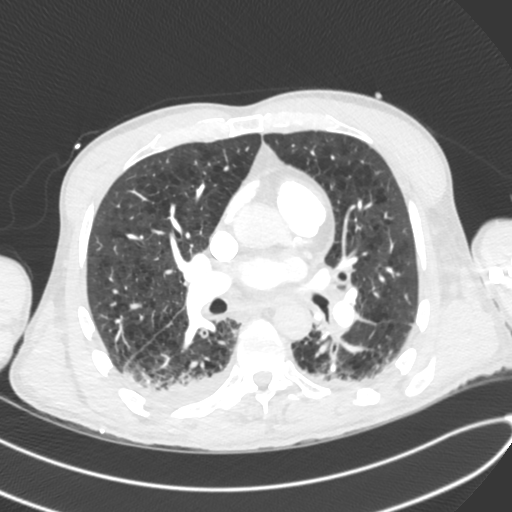
[im 289/473  soft-tissue]
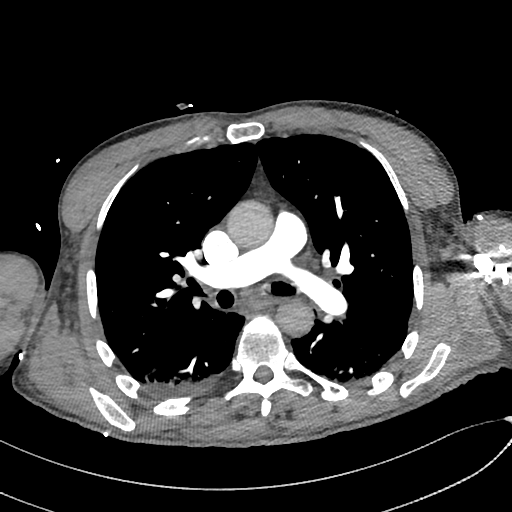
[im 315/473  lung]
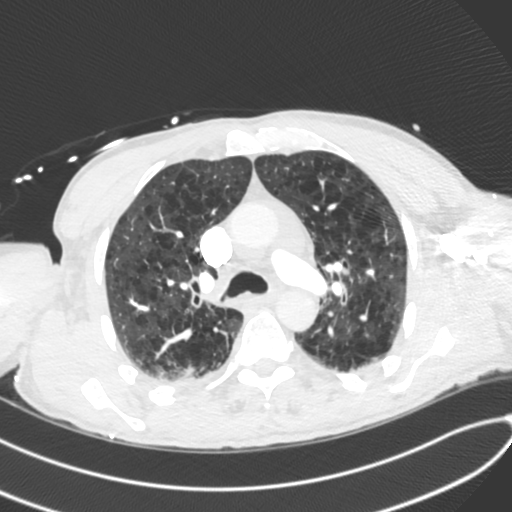
[im 368/473  soft-tissue]
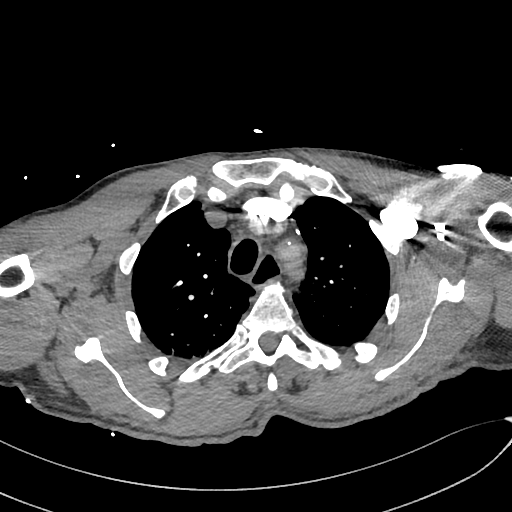
[im 394/473  lung]
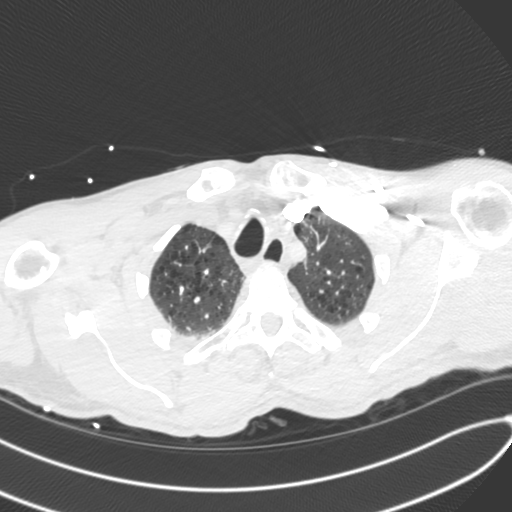
[im 420/473  soft-tissue]
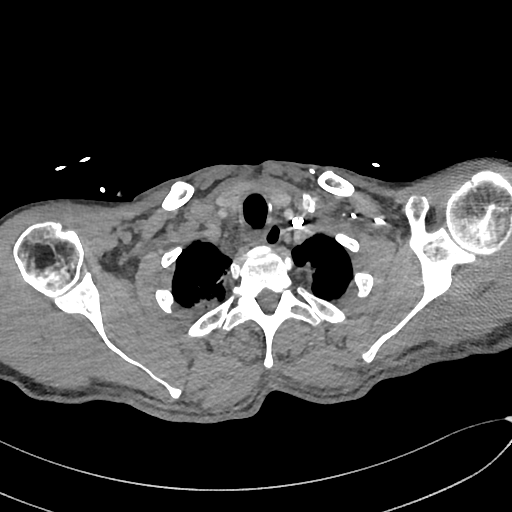
[im 446/473  lung]
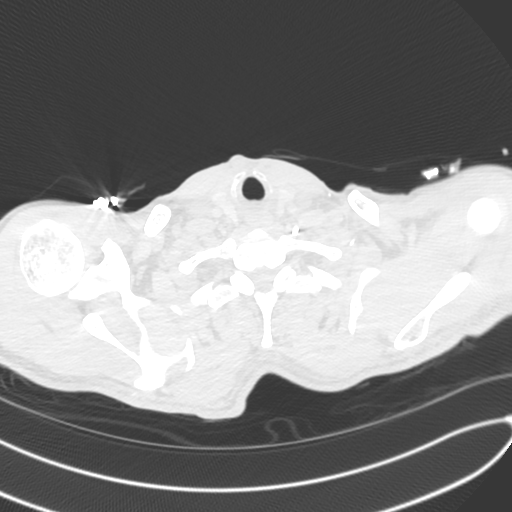

[Series 8: cor · coronal · 0.67mm/px · 3 of 133 slices shown]
[im 34/133  soft-tissue]
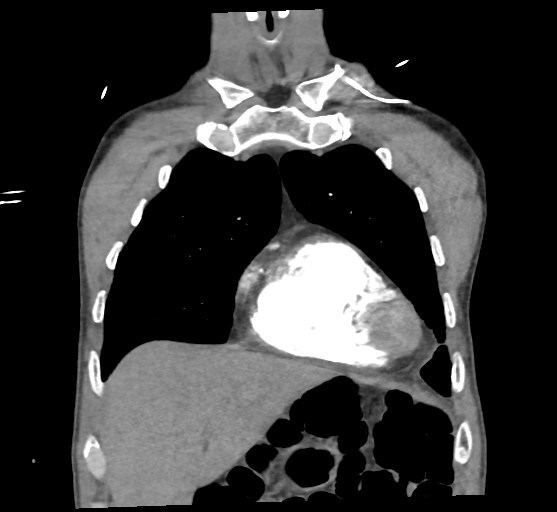
[im 67/133  soft-tissue]
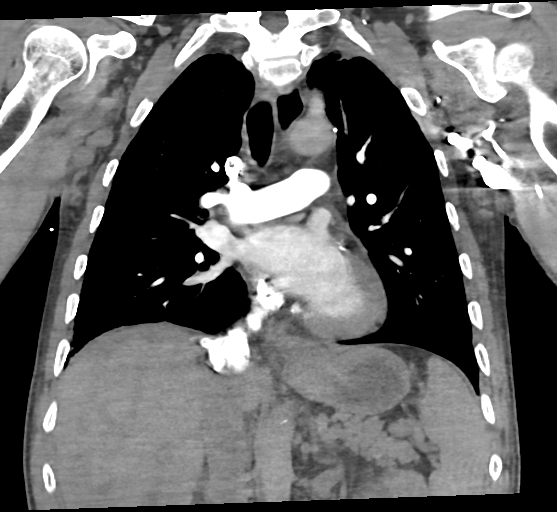
[im 100/133  soft-tissue]
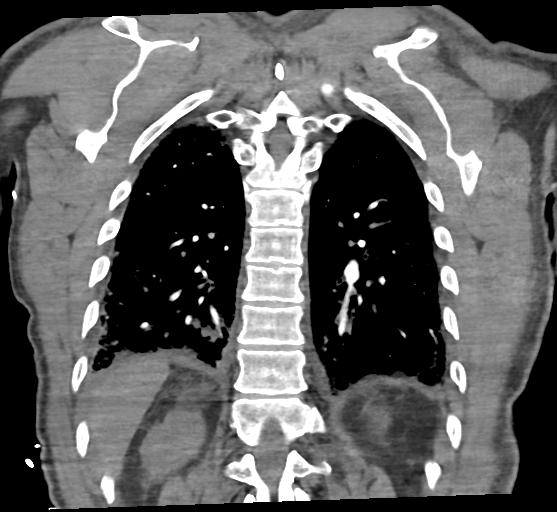

[18 of 46 positions shown; findings below may reference images not displayed]

RADIATION DOSE REDUCTION: This exam was performed according to the
departmental dose-optimization program which includes automated
exposure control, adjustment of the mA and/or kV according to
patient size and/or use of iterative reconstruction technique.

CONTRAST:  100mL OMNIPAQUE IOHEXOL 350 MG/ML SOLN
FINDINGS: Cardiovascular: Satisfactory opacification of the pulmonary arteries
to the segmental level. No evidence of pulmonary embolism. Normal
heart size. No pericardial effusion. There are atherosclerotic
calcifications of the coronary arteries.

Mediastinum/Nodes: There is some mildly hyperdense fluid and
stranding in the anterior mediastinum at the level of the heart.
There are no enlarged mediastinal or hilar lymph nodes. The
visualized thyroid gland is within normal limits. Small hiatal
hernia is present.

Lungs/Pleura: Severe emphysematous changes are present. There is
diffuse peribronchial wall thickening, most significant in the right
lower lobe. Trachea and central airways are patent. There is some
patchy airspace disease and ground-glass opacity in the bilateral
lung bases/lower lobes. There are trace bilateral pleural effusions.

Upper Abdomen: No acute abnormality.

Musculoskeletal: No chest wall abnormality. No acute or significant
osseous findings.

Review of the MIP images confirms the above findings.
IMPRESSION: 1. No evidence for pulmonary embolism.
2. Small amount of fluid and stranding in the anterior mediastinum
of uncertain etiology. Please correlate clinically for infection or
resolving anterior mediastinal hematoma.
3. Diffuse peribronchial wall thickening may be
infectious/inflammatory or related to edema.
4. Minimal airspace and ground-glass opacities in the bilateral
lower lobes worrisome for infection.
5. Trace bilateral pleural effusions.
6. Severe emphysema.

Emphysema ([email or web address]).
